# Patient Record
Sex: Female | Born: 1960 | Race: White | Hispanic: No | Smoking: Never smoker
Health system: Southern US, Community
[De-identification: ages and names within clinical notes are randomized; demographics above are authoritative.]

## PROBLEM LIST (undated history)

## (undated) DIAGNOSIS — E8881 Metabolic syndrome: Secondary | ICD-10-CM

## (undated) DIAGNOSIS — N950 Postmenopausal bleeding: Secondary | ICD-10-CM

## (undated) DIAGNOSIS — I10 Essential (primary) hypertension: Secondary | ICD-10-CM

## (undated) DIAGNOSIS — Z973 Presence of spectacles and contact lenses: Secondary | ICD-10-CM

## (undated) DIAGNOSIS — M179 Osteoarthritis of knee, unspecified: Secondary | ICD-10-CM

## (undated) DIAGNOSIS — J302 Other seasonal allergic rhinitis: Secondary | ICD-10-CM

## (undated) DIAGNOSIS — T7840XA Allergy, unspecified, initial encounter: Secondary | ICD-10-CM

## (undated) DIAGNOSIS — F419 Anxiety disorder, unspecified: Secondary | ICD-10-CM

## (undated) DIAGNOSIS — M1710 Unilateral primary osteoarthritis, unspecified knee: Secondary | ICD-10-CM

## (undated) DIAGNOSIS — C4490 Unspecified malignant neoplasm of skin, unspecified: Secondary | ICD-10-CM

## (undated) DIAGNOSIS — E785 Hyperlipidemia, unspecified: Secondary | ICD-10-CM

## (undated) DIAGNOSIS — Z8619 Personal history of other infectious and parasitic diseases: Secondary | ICD-10-CM

## (undated) DIAGNOSIS — B009 Herpesviral infection, unspecified: Secondary | ICD-10-CM

## (undated) HISTORY — DX: Unspecified malignant neoplasm of skin, unspecified: C44.90

## (undated) HISTORY — DX: Herpesviral infection, unspecified: B00.9

## (undated) HISTORY — DX: Hyperlipidemia, unspecified: E78.5

## (undated) HISTORY — PX: ABDOMINAL HYSTERECTOMY: SHX81

## (undated) HISTORY — DX: Osteoarthritis of knee, unspecified: M17.9

## (undated) HISTORY — DX: Allergy, unspecified, initial encounter: T78.40XA

## (undated) HISTORY — DX: Personal history of other infectious and parasitic diseases: Z86.19

## (undated) HISTORY — PX: BREAST CYST EXCISION: SHX579

## (undated) HISTORY — DX: Metabolic syndrome: E88.81

## (undated) HISTORY — DX: Essential (primary) hypertension: I10

## (undated) HISTORY — DX: Other seasonal allergic rhinitis: J30.2

## (undated) HISTORY — DX: Unilateral primary osteoarthritis, unspecified knee: M17.10

---
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
## 1995-10-09 HISTORY — PX: DILATION AND CURETTAGE OF UTERUS: SHX78

## 1998-03-25 ENCOUNTER — Other Ambulatory Visit: Admission: RE | Admit: 1998-03-25 | Discharge: 1998-03-25 | Payer: Self-pay | Admitting: *Deleted

## 2006-03-14 ENCOUNTER — Other Ambulatory Visit: Admission: RE | Admit: 2006-03-14 | Discharge: 2006-03-14 | Payer: Self-pay | Admitting: Obstetrics & Gynecology

## 2006-03-28 ENCOUNTER — Encounter: Admission: RE | Admit: 2006-03-28 | Discharge: 2006-03-28 | Payer: Self-pay | Admitting: Obstetrics & Gynecology

## 2015-02-06 HISTORY — PX: KNEE ARTHROSCOPY: SHX127

## 2015-09-09 ENCOUNTER — Ambulatory Visit (INDEPENDENT_AMBULATORY_CARE_PROVIDER_SITE_OTHER): Payer: BLUE CROSS/BLUE SHIELD | Admitting: Obstetrics and Gynecology

## 2015-09-09 ENCOUNTER — Encounter: Payer: Self-pay | Admitting: Obstetrics and Gynecology

## 2015-09-09 VITALS — BP 136/82 | HR 96 | Ht 63.5 in | Wt 198.0 lb

## 2015-09-09 DIAGNOSIS — N898 Other specified noninflammatory disorders of vagina: Secondary | ICD-10-CM

## 2015-09-09 DIAGNOSIS — R300 Dysuria: Secondary | ICD-10-CM | POA: Diagnosis not present

## 2015-09-09 DIAGNOSIS — N766 Ulceration of vulva: Secondary | ICD-10-CM | POA: Diagnosis not present

## 2015-09-09 LAB — POCT URINALYSIS DIPSTICK
BILIRUBIN UA: NEGATIVE
Glucose, UA: NEGATIVE
Ketones, UA: NEGATIVE
NITRITE UA: NEGATIVE
PH UA: 6
Protein, UA: NEGATIVE
UROBILINOGEN UA: NEGATIVE

## 2015-09-09 MED ORDER — VALACYCLOVIR HCL 1 G PO TABS
1000.0000 mg | ORAL_TABLET | Freq: Two times a day (BID) | ORAL | Status: DC
Start: 2015-09-09 — End: 2015-09-28

## 2015-09-09 MED ORDER — LIDOCAINE 5 % EX OINT: 1.0000 "application " | TOPICAL_OINTMENT | Freq: Three times a day (TID) | CUTANEOUS | Status: DC

## 2015-09-09 NOTE — Progress Notes (Signed)
Patient ID: Jeanne Parks, female   DOB: [birth year], 54 y.o.   MRN: [id number]  GYNECOLOGY  VISIT   HPI: 54 y.o.   Married  Caucasian  female   [id number] with Patient's last menstrual period was 01/06/2014 (approximate).   here for vaginal irritation. She c/o a 1 week h/o mild vulvar irritation, over the last few days it has gotten worse. No pruritus, c/o generalized rawness. Feels like she is using sandpaper when she wipes. No blisters or sores, no h/o herpes. Her husband has a h/o oral HSV, he is her only partner. She c/o yellow d/c. No odor. She tried monistat x 1, 2 days ago, burned and didn't help. Only external burning with urination. No frequency or urgency to void.  Husband has a h/o a kidney transplant and is on immunosuppression.   GYNECOLOGIC HISTORY: Patient's last menstrual period was 01/06/2014 (approximate). Contraception: condoms and post menopausal status Menopausal hormone therapy: None        OB History    Gravida Para Term Preterm AB TAB SAB Ectopic Multiple Living   2 1 1  0 1 0 1 0 0 1         There are no active problems to display for this patient.   History reviewed. No pertinent past medical history.  Past Surgical History  Procedure Laterality Date  . Cesarean section  08/27/86  . Dilation and curettage of uterus  1997    Miscarriage  . Knee arthroscopy Right 02/2015  . Breast surgery Left 1990's    removal of cyst    Current Outpatient Prescriptions  Medication Sig Dispense Refill  . Biotin 10 MG TABS Take 1 tablet by mouth daily.    Marland Kitchen ibuprofen (ADVIL,MOTRIN) 800 MG tablet Take 800 mg by mouth daily.     No current facility-administered medications for this visit.     ALLERGIES: Review of patient's allergies indicates no known allergies.  Family History  Problem Relation Age of Onset  . Breast cancer Mother 8    lumpectomy and radiation; estrogen fed  . Heart disease Father   . Diabetes Sister 13    Type 1  . Cancer Maternal Aunt    lung  . Cancer Paternal Uncle     ? type    Social History   Social History  . Marital Status: Married    Spouse Name: N/A  . Number of Children: 1  . Years of Education: N/A   Occupational History  . Not on file.   Social History Main Topics  . Smoking status: Never Smoker   . Smokeless tobacco: Never Used  . Alcohol Use: 0.0 - 0.6 oz/week    0-1 Standard drinks or equivalent per week  . Drug Use: No  . Sexual Activity:    Partners: Male    Birth Control/ Protection: Post-menopausal, Condom   Other Topics Concern  . Not on file   Social History Narrative  . No narrative on file    Review of Systems  Genitourinary: Positive for dysuria.  All other systems reviewed and are negative.   PHYSICAL EXAMINATION:    BP 136/82 mmHg  Pulse 96  Ht 5' 3.5" (1.613 m)  Wt 198 lb (89.812 kg)  BMI 34.52 kg/m2  LMP 01/06/2014 (Approximate)    General appearance: alert, cooperative and appears stated age  Pelvic: External genitalia: several small ulcers just outside the vagina on the inner labia minora bilaterally, opening of the vagina is erythematous  Urethra:  normal appearing urethra with no masses, tenderness or lesions              Bartholins and Skenes: normal                 Vagina: erythema near the opening. Slight increase in white watery d/c              Cervix: no lesions               Chaperone was present for exam.  Wet prep: ? Clue (suspect artifact from the monistat), no trich, ++ wbc KOH: no yeast PH: 5   ASSESSMENT Vulvar ulcers, cw herpes. Husband with a h/o oral hsv Vaginal d/c, negative slides Overdue for an annual and mammogram     PLAN Valtrex x 10 days HSV culture Wet prep probe Will discuss suppression after results are back (husband is immunosuppressed) Will set her up for an annual Overdue for a mammogram   An After Visit Summary was printed and given to the patient.  30 minutes face to face time of which over 50% was  spent in counseling.

## 2015-09-10 LAB — WET PREP BY MOLECULAR PROBE
CANDIDA SPECIES: NEGATIVE
GARDNERELLA VAGINALIS: POSITIVE — AB
Trichomonas vaginosis: NEGATIVE

## 2015-09-12 ENCOUNTER — Other Ambulatory Visit: Payer: Self-pay | Admitting: *Deleted

## 2015-09-12 LAB — HERPES SIMPLEX VIRUS CULTURE: ORGANISM ID, BACTERIA: DETECTED

## 2015-09-12 MED ORDER — METRONIDAZOLE 500 MG PO TABS: 500.0000 mg | ORAL_TABLET | Freq: Two times a day (BID) | ORAL | Status: DC

## 2015-09-14 ENCOUNTER — Telehealth: Payer: Self-pay | Admitting: *Deleted

## 2015-09-14 NOTE — Telephone Encounter (Signed)
Can we call this patient back together please ? -eh

## 2015-09-28 ENCOUNTER — Ambulatory Visit (INDEPENDENT_AMBULATORY_CARE_PROVIDER_SITE_OTHER): Payer: BLUE CROSS/BLUE SHIELD | Admitting: Obstetrics and Gynecology

## 2015-09-28 ENCOUNTER — Encounter: Payer: Self-pay | Admitting: Obstetrics and Gynecology

## 2015-09-28 VITALS — BP 142/80 | HR 96 | Resp 14 | Ht 62.75 in | Wt 197.0 lb

## 2015-09-28 DIAGNOSIS — Z124 Encounter for screening for malignant neoplasm of cervix: Secondary | ICD-10-CM | POA: Diagnosis not present

## 2015-09-28 DIAGNOSIS — Z1211 Encounter for screening for malignant neoplasm of colon: Secondary | ICD-10-CM

## 2015-09-28 DIAGNOSIS — Z0000 Encounter for general adult medical examination without abnormal findings: Secondary | ICD-10-CM | POA: Diagnosis not present

## 2015-09-28 DIAGNOSIS — K5900 Constipation, unspecified: Secondary | ICD-10-CM

## 2015-09-28 DIAGNOSIS — K602 Anal fissure, unspecified: Secondary | ICD-10-CM | POA: Diagnosis not present

## 2015-09-28 DIAGNOSIS — R635 Abnormal weight gain: Secondary | ICD-10-CM | POA: Diagnosis not present

## 2015-09-28 DIAGNOSIS — Z833 Family history of diabetes mellitus: Secondary | ICD-10-CM | POA: Diagnosis not present

## 2015-09-28 DIAGNOSIS — B372 Candidiasis of skin and nail: Secondary | ICD-10-CM

## 2015-09-28 DIAGNOSIS — A6000 Herpesviral infection of urogenital system, unspecified: Secondary | ICD-10-CM

## 2015-09-28 DIAGNOSIS — Z01419 Encounter for gynecological examination (general) (routine) without abnormal findings: Secondary | ICD-10-CM

## 2015-09-28 LAB — CBC
HEMATOCRIT: 38.4 % (ref 36.0–46.0)
Hemoglobin: 12.3 g/dL (ref 12.0–15.0)
MCH: 29 pg (ref 26.0–34.0)
MCHC: 32 g/dL (ref 30.0–36.0)
MCV: 90.6 fL (ref 78.0–100.0)
MPV: 9.8 fL (ref 8.6–12.4)
Platelets: 340 10*3/uL (ref 150–400)
RBC: 4.24 MIL/uL (ref 3.87–5.11)
RDW: 15 % (ref 11.5–15.5)
WBC: 7.6 10*3/uL (ref 4.0–10.5)

## 2015-09-28 LAB — COMPREHENSIVE METABOLIC PANEL
ALT: 22 U/L (ref 6–29)
AST: 19 U/L (ref 10–35)
Albumin: 4.2 g/dL (ref 3.6–5.1)
Alkaline Phosphatase: 121 U/L (ref 33–130)
BUN: 16 mg/dL (ref 7–25)
CHLORIDE: 102 mmol/L (ref 98–110)
CO2: 27 mmol/L (ref 20–31)
Calcium: 9.8 mg/dL (ref 8.6–10.4)
Creat: 0.8 mg/dL (ref 0.50–1.05)
Glucose, Bld: 109 mg/dL — ABNORMAL HIGH (ref 65–99)
POTASSIUM: 4.2 mmol/L (ref 3.5–5.3)
Sodium: 139 mmol/L (ref 135–146)
TOTAL PROTEIN: 7.1 g/dL (ref 6.1–8.1)
Total Bilirubin: 0.6 mg/dL (ref 0.2–1.2)

## 2015-09-28 LAB — TSH: TSH: 1.13 u[IU]/mL (ref 0.350–4.500)

## 2015-09-28 LAB — LIPID PANEL
CHOL/HDL RATIO: 4.2 ratio (ref <=5.0)
CHOLESTEROL: 208 mg/dL — AB (ref 125–200)
HDL: 49 mg/dL (ref >=46)
LDL Cholesterol: 141 mg/dL — ABNORMAL HIGH (ref <130)
Triglycerides: 91 mg/dL (ref <150)
VLDL: 18 mg/dL (ref <30)

## 2015-09-28 MED ORDER — HYDROCORTISONE 2.5 % RE CREA: TOPICAL_CREAM | RECTAL | Status: DC

## 2015-09-28 MED ORDER — VALACYCLOVIR HCL 500 MG PO TABS: ORAL_TABLET | ORAL | Status: DC

## 2015-09-28 MED ORDER — NYSTATIN 100000 UNIT/GM EX CREA: 1.0000 "application " | TOPICAL_CREAM | Freq: Two times a day (BID) | CUTANEOUS | Status: DC

## 2015-09-28 NOTE — Patient Instructions (Signed)
Anal Fissure, Adult An anal fissure is a small tear or crack in the skin around the anus. Bleeding from a fissure usually stops on its own within a few minutes. However, bleeding will often occur again with each bowel movement until the crack heals. CAUSES This condition may be caused by:  Passing large, hard stool (feces).  Frequent diarrhea.  Constipation.  Inflammatory bowel disease (Crohn disease or ulcerative colitis).  Infections.  Anal sex. SYMPTOMS Symptoms of this condition include:  Bleeding from the rectum.  Small amounts of blood seen on your stool, on toilet paper, or in the toilet after a bowel movement.  Painful bowel movements.  Itching or irritation around the anus. DIAGNOSIS A health care provider may diagnose this condition by closely examining the anal area. An anal fissure can usually be seen with careful inspection. In some cases, a rectal exam may be performed, or a short tube (anoscope) may be used to examine the anal canal. TREATMENT Treatment for this condition may include:  Taking steps to avoid constipation. This may include making changes to your diet, such as increasing your intake of fiber or fluid.  Taking fiber supplements. These supplements can soften your stool to help make bowel movements easier. Your health care provider may also prescribe a stool softener if your stool is often hard.  Taking sitz baths. This may help to heal the tear.  Using medicated creams or ointments. These may be prescribed to lessen discomfort. HOME CARE INSTRUCTIONS Eating and Drinking  Avoid foods that may be constipating, such as bananas and dairy products.  Drink enough fluid to keep your urine clear or pale yellow.  Maintain a diet that is high in fruits, whole grains, and vegetables. General Instructions  Keep the anal area as clean and dry as possible.  Take sitz baths as told by your health care provider. Do not use soap in the sitz baths.  Take  over-the-counter and prescription medicines only as told by your health care provider.  Use creams or ointments only as told by your health care provider.  Keep all follow-up visits as told by your health care provider. This is important. SEEK MEDICAL CARE IF:  You have more bleeding.  You have a fever.  You have diarrhea that is mixed with blood.  You continue to have pain.  Your problem is getting worse rather than better.   This information is not intended to replace advice given to you by your health care provider. Make sure you discuss any questions you have with your health care provider.   Document Released: 09/24/2005 Document Revised: 06/15/2015 Document Reviewed: 12/20/2014 Elsevier Interactive Patient Education 2016 [employer]. About Constipation  Constipation Overview Constipation is the most common gastrointestinal complaint - about 4 million Americans experience constipation and make 2.5 million physician visits a year to get help for the problem.  Constipation can occur when the colon absorbs too much water, the colon's muscle contraction is slow or sluggish, and/or there is delayed transit time through the colon.  The result is stool that is hard and dry.  Indicators of constipation include straining during bowel movements greater than 25% of the time, having fewer than three bowel movements per week, and/or the feeling of incomplete evacuation.  There are established guidelines ([place] II ) for defining constipation. A person needs to have two or more of the following symptoms for at least 12 weeks (not necessarily consecutive) in the preceding 12 months: . Straining in  greater than  25% of bowel movements . Lumpy or hard stools in greater than 25% of bowel movements . Sensation of incomplete emptying in greater than 25% of bowel movements . Sensation of anorectal obstruction/blockade in greater than 25% of bowel movements . Manual maneuvers to help empty greater than  25% of bowel movements (e.g., digital evacuation, support of the pelvic floor)  . Less than  3 bowel movements/week . Loose stools are not present, and criteria for irritable bowel syndrome are insufficient  Common Causes of Constipation . Lack of fiber in your diet . Lack of physical activity . Medications, including iron and calcium supplements  . Dairy intake . Dehydration . Abuse of laxatives  Travel  Irritable Bowel Syndrome  Pregnancy  Luteal phase of menstruation (after ovulation and before menses)  Colorectal problems  Intestinal Dysfunction  Treating Constipation  There are several ways of treating constipation, including changes to diet and exercise, use of laxatives, adjustments to the pelvic floor, and scheduled toileting.  These treatments include: . increasing fiber and fluids in the diet  . increasing physical activity . learning muscle coordination   learning proper toileting techniques and toileting modifications   designing and sticking  to a toileting schedule     2007, [employer] Doc.22  EXERCISE AND DIET:  We recommended that you start or continue a regular exercise program for good health. Regular exercise means any activity that makes your heart beat faster and makes you sweat.  We recommend exercising at least 30 minutes per day at least 3 days a week, preferably 4 or 5.  We also recommend a diet low in fat and sugar.  Inactivity, poor dietary choices and obesity can cause diabetes, heart attack, stroke, and kidney damage, among others.    ALCOHOL AND SMOKING:  Women should limit their alcohol intake to no more than 7 drinks/beers/glasses of wine (combined, not each!) per week. Moderation of alcohol intake to this level decreases your risk of breast cancer and liver damage. And of course, no recreational drugs are part of a healthy lifestyle.  And absolutely no smoking or even second hand smoke. Most people know smoking can cause heart  and lung diseases, but did you know it also contributes to weakening of your bones? Aging of your skin?  Yellowing of your teeth and nails?  CALCIUM AND VITAMIN D:  Adequate intake of calcium and Vitamin D are recommended.  The recommendations for exact amounts of these supplements seem to change often, but generally speaking 600 mg of calcium (either carbonate or citrate) and 800 units of Vitamin D per day seems prudent. Certain women may benefit from higher intake of Vitamin D.  If you are among these women, your doctor will have told you during your visit.    PAP SMEARS:  Pap smears, to check for cervical cancer or precancers,  have traditionally been done yearly, although recent scientific advances have shown that most women can have pap smears less often.  However, every woman still should have a physical exam from her gynecologist every year. It will include a breast check, inspection of the vulva and vagina to check for abnormal growths or skin changes, a visual exam of the cervix, and then an exam to evaluate the size and shape of the uterus and ovaries.  And after 53 years of age, a rectal exam is indicated to check for rectal cancers. We will also provide age appropriate advice regarding health maintenance, like when you should have certain vaccines, screening  for sexually transmitted diseases, bone density testing, colonoscopy, mammograms, etc.   MAMMOGRAMS:  All women over 62 years old should have a yearly mammogram. Many facilities now offer a "3D" mammogram, which may cost around $50 extra out of pocket. If possible,  we recommend you accept the option to have the 3D mammogram performed.  It both reduces the number of women who will be called back for extra views which then turn out to be normal, and it is better than the routine mammogram at detecting truly abnormal areas.    COLONOSCOPY:  Colonoscopy to screen for colon cancer is recommended for all women at age 61.  We know, you hate the  idea of the prep.  We agree, BUT, having colon cancer and not knowing it is worse!!  Colon cancer so often starts as a polyp that can be seen and removed at colonscopy, which can quite literally save your life!  And if your first colonoscopy is normal and you have no family history of colon cancer, most women don't have to have it again for 10 years.  Once every ten years, you can do something that may end up saving your life, right?  We will be happy to help you get it scheduled when you are ready.  Be sure to check your insurance coverage so you understand how much it will cost.  It may be covered as a preventative service at no cost, but you should check your particular policy.

## 2015-09-28 NOTE — Progress Notes (Signed)
Patient ID: Jeanne Parks, female   DOB: 1961/04/02, 54 y.o.   MRN: [id number] 54 y.o. [id number] MarriedCaucasianF here for annual exam.  The patient was recently diagnosed with genital HSV, culture + type 1 (husband with oral hsv). The patient is PMP, no bleeding. She is hot all the time. Having hot flashes several times a week. She is having some hot flashes at night, several times a week. No actual sweats.  She c/o a rash intermittently in the creases of her legs, usually gets better with cornstarch, persisting a little longer this time, slightly itchy. Present for a few weeks. She is having issues with constipation, occasionally will notice blood when she wipes when she has to strain, associated with pain. This has happened off and on in the last few months. Last bleed with BM last night. No vaginal irritation, no dryness with intercourse.    Patient's last menstrual period was 01/06/2014 (approximate).          Sexually active: Yes.    The current method of family planning is post menopausal status/ condoms.    Exercising: No.  The patient does not participate in regular exercise at present. Smoker:  no  Health Maintenance: Pap:  2007 WNL History of abnormal Pap:  no MMG:  2007 WNL  Colonoscopy:  Never BMD:   Never TDaP:  unsure Gardasil: N/A   reports that she has never smoked. She has never used smokeless tobacco. She reports that she drinks about 0.6 - 1.2 oz of alcohol per week. She reports that she does not use illicit drugs.  Past Medical History  Diagnosis Date  . HSV-1 infection     Past Surgical History  Procedure Laterality Date  . Cesarean section  08/27/86  . Dilation and curettage of uterus  1997    Miscarriage  . Knee arthroscopy Right 02/2015  . Breast surgery Left 1990's    removal of cyst    Current Outpatient Prescriptions  Medication Sig Dispense Refill  . Biotin 10 MG TABS Take 1 tablet by mouth daily.    Marland Kitchen ibuprofen (ADVIL,MOTRIN) 800 MG tablet Take 800  mg by mouth daily.    Marland Kitchen lidocaine (XYLOCAINE) 5 % ointment Apply 1 application topically 3 (three) times daily. Prn (Patient not taking: Reported on 09/28/2015) 30 g 0  . valACYclovir (VALTREX) 1000 MG tablet Take 1 tablet (1,000 mg total) by mouth 2 (two) times daily. (Patient not taking: Reported on 09/28/2015) 20 tablet 0   No current facility-administered medications for this visit.    Family History  Problem Relation Age of Onset  . Breast cancer Mother 3    lumpectomy and radiation; estrogen fed  . Heart disease Father   . Diabetes Sister 13    Type 1  . Cancer Maternal Aunt     lung  . Cancer Paternal Uncle     ? type    Review of Systems  Constitutional: Negative.   HENT: Negative.   Eyes: Negative.   Respiratory: Negative.   Cardiovascular: Negative.   Gastrointestinal: Positive for constipation.  Endocrine: Negative.   Genitourinary: Negative.   Musculoskeletal: Negative.   Skin: Positive for rash.       Rash in the groin area and under breast  Allergic/Immunologic: Negative.   Neurological: Negative.   Psychiatric/Behavioral: Negative.   She has been constipated, straining recently, sometime a small spot of blood when she wipes, when it hurts. BM q day, hard.   Exam:   BP 148/84  mmHg  Pulse 96  Resp 14  Ht 5' 2.75" (1.594 m)  Wt 197 lb (89.359 kg)  BMI 35.17 kg/m2  LMP 01/06/2014 (Approximate)  Weight change: '@WEIGHTCHANGE' @ Height:   Height: 5' 2.75" (159.4 cm)  Ht Readings from Last 3 Encounters:  09/28/15 5' 2.75" (1.594 m)  09/09/15 5' 3.5" (1.613 m)    General appearance: alert, cooperative and appears stated age Head: Normocephalic, without obvious abnormality, atraumatic Neck: no adenopathy, supple, symmetrical, trachea midline and thyroid normal to inspection and palpation Lungs: clear to auscultation bilaterally Breasts: normal appearance, no masses or tenderness Heart: regular rate and rhythm Abdomen: soft, non-tender; bowel sounds normal;  no masses,  no organomegaly Extremities: extremities normal, atraumatic, no cyanosis or edema Skin: Skin color, texture, turgor normal. Erythematous scaly rash bilateral groin, cw candida intertrigo Lymph nodes: Cervical, supraclavicular, and axillary nodes normal. No abnormal inguinal nodes palpated Neurologic: Grossly normal   Pelvic: External genitalia:  no lesions              Urethra:  normal appearing urethra with no masses, tenderness or lesions              Bartholins and Skenes: normal                 Vagina: normal appearing vagina with normal color and discharge, no lesions              Cervix: no lesions               Bimanual Exam:  Uterus:  normal size, contour, position, consistency, mobility, non-tender              Adnexa: no mass, fullness, tenderness               Rectovaginal: Confirms               Anus:  normal sphincter tone, anal fissure at 12 o'clock  Chaperone was present for exam.  A:  Well Woman with normal exam  Family history of breast cancer and diabetes  Constipation  Genital HSV  Anal fissure  Candida intertrigo  P:   Pap with hpv  Mammogram  Discussed constipation, dietary changes, metamucil  Stool testing for blood, kit given  Colonoscopy recommended, she will consider  Discussed calcium and vit D  Breast self exams  Discussed weight loss  Anusol for anal fissure, discussed the importance of soft stool to get her fissure to heal  Nystatin for yeast

## 2015-09-29 LAB — HEMOGLOBIN A1C
Hgb A1c MFr Bld: 5.7 % — ABNORMAL HIGH (ref <5.7)
Mean Plasma Glucose: 117 mg/dL — ABNORMAL HIGH (ref <117)

## 2015-09-29 LAB — VITAMIN D 25 HYDROXY (VIT D DEFICIENCY, FRACTURES): VIT D 25 HYDROXY: 13 ng/mL — AB (ref 30–100)

## 2015-09-30 LAB — IPS PAP TEST WITH HPV

## 2015-10-06 ENCOUNTER — Telehealth: Payer: Self-pay | Admitting: Emergency Medicine

## 2015-10-06 ENCOUNTER — Encounter: Payer: Self-pay | Admitting: Emergency Medicine

## 2015-10-06 DIAGNOSIS — E559 Vitamin D deficiency, unspecified: Secondary | ICD-10-CM

## 2015-10-06 MED ORDER — VITAMIN D (ERGOCALCIFEROL) 1.25 MG (50000 UNIT) PO CAPS: 50000.0000 [IU] | ORAL_CAPSULE | ORAL | Status: DC

## 2015-10-06 NOTE — Telephone Encounter (Signed)
-----   Message from [name], MD sent at 10/05/2015  5:11 PM EST ----- 02 Please inform the patient that her vit D level is very low and call in 50,000 IU of vit d q week x 12 weeks. Then she should return for another vit D level. She is pre-diabetic and needs to f/u with a primary MD, please give her names if needed. Her total cholesterol and LDL are slightly elevated. She should be eating a diet low in saturated fats and exercise regularly.

## 2015-10-06 NOTE — Telephone Encounter (Signed)
Call to patient and she is given results from Dr. Talbert Nan.  Voices clear understanding of results and follow up as directed by Dr. Talbert Nan. Knows to follow up for lab draw aftger completing vitamin D prescription.   Patient given numbers for [facility] practices and encouraged to follow up and establish primary care for cholesterol and A1C. She will call back if she has any difficulties obtaining primary care appointments in [place], [place].   Patient requests that copies of lab results be mailed to her home address. Patient name, DOB and home address confirmed verbally with patient and verbal release of records completed and mailed to home address of record.  Routing to provider for final review. Patient agreeable to disposition. Will close encounter.   02 recall placed.

## 2015-12-27 ENCOUNTER — Other Ambulatory Visit: Payer: Self-pay | Admitting: Obstetrics and Gynecology

## 2015-12-27 NOTE — Telephone Encounter (Signed)
Medication refill request: Vitamin D Last AEX:  09-28-15 Next AEX: 10-24-16 Last MMG (if hormonal medication request): 03/29/06 WNL Refill authorized: please advise

## 2015-12-27 NOTE — Telephone Encounter (Signed)
[place] in regards to med refill- eh

## 2015-12-27 NOTE — Telephone Encounter (Signed)
I spoke with patient and set her up for vitamin D recheck on 12/29/15.

## 2015-12-27 NOTE — Telephone Encounter (Signed)
The patient needs a repeat vit D level after 12 weeks of taking the vit D 50,000 IU a week. Please set her up for a lab appointment. I would not refill the script until we have the new level. The order was already placed.

## 2015-12-29 ENCOUNTER — Other Ambulatory Visit (INDEPENDENT_AMBULATORY_CARE_PROVIDER_SITE_OTHER): Payer: BLUE CROSS/BLUE SHIELD

## 2015-12-29 DIAGNOSIS — E559 Vitamin D deficiency, unspecified: Secondary | ICD-10-CM

## 2015-12-29 LAB — VITAMIN D 25 HYDROXY (VIT D DEFICIENCY, FRACTURES): VIT D 25 HYDROXY: 42 ng/mL (ref 30–100)

## 2016-01-26 NOTE — Addendum Note (Signed)
Addended by: Graylon Good on: 01/26/2016 12:31 PM   Modules accepted: Orders, SmartSet

## 2016-10-24 ENCOUNTER — Ambulatory Visit: Payer: BLUE CROSS/BLUE SHIELD | Admitting: Obstetrics and Gynecology

## 2016-10-31 ENCOUNTER — Encounter: Payer: Self-pay | Admitting: Obstetrics and Gynecology

## 2016-10-31 ENCOUNTER — Ambulatory Visit (INDEPENDENT_AMBULATORY_CARE_PROVIDER_SITE_OTHER): Payer: BLUE CROSS/BLUE SHIELD | Admitting: Obstetrics and Gynecology

## 2016-10-31 VITALS — BP 130/74 | HR 100 | Resp 16 | Ht 62.75 in | Wt 197.0 lb

## 2016-10-31 DIAGNOSIS — Z0000 Encounter for general adult medical examination without abnormal findings: Secondary | ICD-10-CM | POA: Diagnosis not present

## 2016-10-31 DIAGNOSIS — A6000 Herpesviral infection of urogenital system, unspecified: Secondary | ICD-10-CM

## 2016-10-31 DIAGNOSIS — Z01419 Encounter for gynecological examination (general) (routine) without abnormal findings: Secondary | ICD-10-CM | POA: Diagnosis not present

## 2016-10-31 DIAGNOSIS — E559 Vitamin D deficiency, unspecified: Secondary | ICD-10-CM

## 2016-10-31 DIAGNOSIS — E663 Overweight: Secondary | ICD-10-CM

## 2016-10-31 DIAGNOSIS — Z23 Encounter for immunization: Secondary | ICD-10-CM | POA: Diagnosis not present

## 2016-10-31 LAB — COMPREHENSIVE METABOLIC PANEL
ALBUMIN: 4.1 g/dL (ref 3.6–5.1)
ALT: 17 U/L (ref 6–29)
AST: 17 U/L (ref 10–35)
Alkaline Phosphatase: 124 U/L (ref 33–130)
BILIRUBIN TOTAL: 0.4 mg/dL (ref 0.2–1.2)
BUN: 19 mg/dL (ref 7–25)
CHLORIDE: 105 mmol/L (ref 98–110)
CO2: 25 mmol/L (ref 20–31)
CREATININE: 1 mg/dL (ref 0.50–1.05)
Calcium: 9.7 mg/dL (ref 8.6–10.4)
Glucose, Bld: 108 mg/dL — ABNORMAL HIGH (ref 65–99)
Potassium: 4.3 mmol/L (ref 3.5–5.3)
SODIUM: 141 mmol/L (ref 135–146)
TOTAL PROTEIN: 6.8 g/dL (ref 6.1–8.1)

## 2016-10-31 LAB — LIPID PANEL
CHOL/HDL RATIO: 4 ratio (ref <5.0)
Cholesterol: 191 mg/dL (ref <200)
HDL: 48 mg/dL — AB (ref >50)
LDL CALC: 122 mg/dL — AB (ref <100)
Triglycerides: 105 mg/dL (ref <150)
VLDL: 21 mg/dL (ref <30)

## 2016-10-31 LAB — CBC
HCT: 40.5 % (ref 35.0–45.0)
HEMOGLOBIN: 12.9 g/dL (ref 11.7–15.5)
MCH: 29.7 pg (ref 27.0–33.0)
MCHC: 31.9 g/dL — ABNORMAL LOW (ref 32.0–36.0)
MCV: 93.1 fL (ref 80.0–100.0)
MPV: 9.7 fL (ref 7.5–12.5)
PLATELETS: 299 10*3/uL (ref 140–400)
RBC: 4.35 MIL/uL (ref 3.80–5.10)
RDW: 14 % (ref 11.0–15.0)
WBC: 10.2 10*3/uL (ref 3.8–10.8)

## 2016-10-31 MED ORDER — VALACYCLOVIR HCL 500 MG PO TABS: ORAL_TABLET | ORAL | 3 refills | Status: DC

## 2016-10-31 NOTE — Patient Instructions (Signed)

## 2016-10-31 NOTE — Progress Notes (Signed)
56 y.o. [id number] MarriedCaucasianF here for annual exam.   She was diagnosed with genital herpes last year. Thinks she has the beginning of an outbreak and started Valtrex yesterday. No other outbreak. No vaginal bleeding. No dyspareunia.  No medical changes.     Patient's last menstrual period was 01/06/2014 (approximate).          Sexually active: Yes.    The current method of family planning is post menopausal status.    Exercising: No.  The patient does not participate in regular exercise at present. Smoker:  no  Health Maintenance: Pap:  09/28/15 Pap smear and HR HPV negative History of abnormal Pap:  no MMG:  03/29/2006 BIRADS 1 negative Colonoscopy:  never BMD:   never TDaP:  unsure Gardasil: N/A   reports that she has never smoked. She has never used smokeless tobacco. She reports that she drinks about 0.6 - 1.2 oz of alcohol per week . She reports that she does not use drugs. 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 Daughter is 32, got married last year. Has 2 step grandchildren (17 and 11).   Past Medical History:  Diagnosis Date  . HSV-1 infection     Past Surgical History:  Procedure Laterality Date  . BREAST SURGERY Left 1990's   removal of cyst  . CESAREAN SECTION  08/27/86  . DILATION AND CURETTAGE OF UTERUS  1997   Miscarriage  . KNEE ARTHROSCOPY Right 02/2015    Current Outpatient Prescriptions  Medication Sig Dispense Refill  . Biotin 10 MG TABS Take 1 tablet by mouth daily.    . hydrocortisone (ANUSOL-HC) 2.5 % rectal cream Apply topically BID prn 30 g 1  . ibuprofen (ADVIL,MOTRIN) 800 MG tablet Take 800 mg by mouth daily.    Marland Kitchen lidocaine (XYLOCAINE) 5 % ointment Apply 1 application topically 3 (three) times daily. Prn 30 g 0  . nystatin cream (MYCOSTATIN) Apply 1 application topically 2 (two) times daily. Apply to affected area BID for up to 7 days. 30 g 1  . valACYclovir (VALTREX) 500 MG tablet 1 tab po BID x 3 days prn 30 tablet 3  . Vitamin D,  Ergocalciferol, (DRISDOL) 50000 units CAPS capsule Take 1 capsule (50,000 Units total) by mouth every 7 (seven) days. 12 capsule 0   No current facility-administered medications for this visit.     Family History  Problem Relation Age of Onset  . Breast cancer Mother 65    lumpectomy and radiation; estrogen fed  . Heart disease Father   . Diabetes Sister 13    Type 1  . Cancer Maternal Aunt     lung  . Cancer Paternal Uncle     ? type    Review of Systems  Constitutional: Negative.   HENT: Negative.   Eyes: Negative.   Respiratory: Negative.   Cardiovascular: Negative.   Gastrointestinal: Negative.   Endocrine: Negative.   Genitourinary: Negative.   Musculoskeletal: Negative.   Skin: Negative.   Allergic/Immunologic: Negative.   Neurological: Negative.   Hematological: Negative.   Psychiatric/Behavioral: Negative.     Exam:   BP 130/74 (BP Location: Right Arm, Patient Position: Sitting, Cuff Size: Normal)   Pulse 100   Resp 16   Ht 5' 2.75" (1.594 m)   Wt 197 lb (89.4 kg)   LMP 01/06/2014 (Approximate)   BMI 35.18 kg/m   Weight change: @WEIGHTCHANGE @ Height:   Height: 5' 2.75" (159.4 cm)  Ht Readings from Last 3 Encounters:  10/31/16  5' 2.75" (1.594 m)  09/28/15 5' 2.75" (1.594 m)  09/09/15 5' 3.5" (1.613 m)    General appearance: alert, cooperative and appears stated age Head: Normocephalic, without obvious abnormality, atraumatic Neck: no adenopathy, supple, symmetrical, trachea midline and thyroid normal to inspection and palpation Lungs: clear to auscultation bilaterally Cardiovascular: regular rate and rhythm Breasts: normal appearance, no masses or tenderness Heart: regular rate and rhythm Abdomen: soft, non-tender; bowel sounds normal; no masses,  no organomegaly Extremities: extremities normal, atraumatic, no cyanosis or edema Skin: Skin color, texture, turgor normal. No rashes or lesions Lymph nodes: Cervical, supraclavicular, and axillary nodes  normal. No abnormal inguinal nodes palpated Neurologic: Grossly normal   Pelvic: External genitalia:  no lesions              Urethra:  normal appearing urethra with no masses, tenderness or lesions              Bartholins and Skenes: normal                 Vagina: normal appearing vagina with normal color and discharge, no lesions              Cervix: no lesions               Bimanual Exam:  Uterus:  normal size, contour, position, consistency, mobility, non-tender              Adnexa: no mass, fullness, tenderness               Rectovaginal: Confirms               Anus:  normal sphincter tone, no lesions  Chaperone was present for exam.  A:  Well Woman with normal exam  H/O genital hsv  Overweight  P:   No pap this year  Mammogram strongly encouraged  Colonoscopy declined, will do the IFOB  Screening labs  Discussed breast self exam  Discussed calcium and vit D intake  Valtrex prn  TDAP today  Discussed weight watchers and exercise

## 2016-11-01 LAB — HEMOGLOBIN A1C
HEMOGLOBIN A1C: 5.7 % — AB (ref <5.7)
Mean Plasma Glucose: 117 mg/dL

## 2016-11-01 LAB — VITAMIN D 25 HYDROXY (VIT D DEFICIENCY, FRACTURES): VIT D 25 HYDROXY: 35 ng/mL (ref 30–100)

## 2016-12-04 ENCOUNTER — Telehealth: Payer: Self-pay | Admitting: Urology

## 2016-12-04 DIAGNOSIS — Z20828 Contact with and (suspected) exposure to other viral communicable diseases: Secondary | ICD-10-CM

## 2016-12-04 MED ORDER — OSELTAMIVIR PHOSPHATE 75 MG PO CAPS: 75.0000 mg | ORAL_CAPSULE | Freq: Every day | ORAL | 0 refills | Status: AC

## 2016-12-04 NOTE — Telephone Encounter (Signed)
I spoke with the patient by phone and she reports that her husband is currently in the hospital at [facility]- admitted for wound infection in his lower leg and subsequently diagnosed with Influenza today as well.  She has had close contact with him and therefore has been exposed.  She is inquiring as to whether she should be treated for Influenza as well?  She is currently asymptomatic- denies fever, chills, cough, N/V/D or myalgias.  I have advised that she take prophylactic Tamiflu since she will need to continue to be available to help care for him (she is his primary caregiver).  I will send Rx for Tamiflu 75mg  po qd x10 days.  She knows to follow up with her PCP should she develop S/S of Flu despite prophylaxis or should she have any systemic complaints while taking the medication.  Nicholos Johns, PA-C

## 2016-12-28 NOTE — Telephone Encounter (Signed)
Duplicate.  See telephone note in chart.

## 2017-09-18 ENCOUNTER — Other Ambulatory Visit: Payer: Self-pay | Admitting: Obstetrics and Gynecology

## 2017-09-18 NOTE — Telephone Encounter (Signed)
Medication refill request: MYCOSTATIN Last AEX:  10/31/16 JJ Next AEX: 11/06/17 Last MMG (if hormonal medication request): 03/29/2006 BIRADS 1 negative Refill authorized: 09/28/15 #30g w/1 refill; today please advise

## 2017-11-06 ENCOUNTER — Telehealth: Payer: Self-pay | Admitting: *Deleted

## 2017-11-06 ENCOUNTER — Ambulatory Visit (INDEPENDENT_AMBULATORY_CARE_PROVIDER_SITE_OTHER): Payer: 59 | Admitting: Obstetrics and Gynecology

## 2017-11-06 ENCOUNTER — Other Ambulatory Visit: Payer: Self-pay

## 2017-11-06 ENCOUNTER — Encounter: Payer: Self-pay | Admitting: Obstetrics and Gynecology

## 2017-11-06 VITALS — BP 150/88 | HR 100 | Resp 18 | Wt 196.0 lb

## 2017-11-06 DIAGNOSIS — E559 Vitamin D deficiency, unspecified: Secondary | ICD-10-CM | POA: Diagnosis not present

## 2017-11-06 DIAGNOSIS — R030 Elevated blood-pressure reading, without diagnosis of hypertension: Secondary | ICD-10-CM | POA: Diagnosis not present

## 2017-11-06 DIAGNOSIS — Z0000 Encounter for general adult medical examination without abnormal findings: Secondary | ICD-10-CM | POA: Diagnosis not present

## 2017-11-06 DIAGNOSIS — Z6835 Body mass index (BMI) 35.0-35.9, adult: Secondary | ICD-10-CM | POA: Diagnosis not present

## 2017-11-06 DIAGNOSIS — R7303 Prediabetes: Secondary | ICD-10-CM | POA: Diagnosis not present

## 2017-11-06 DIAGNOSIS — K602 Anal fissure, unspecified: Secondary | ICD-10-CM

## 2017-11-06 DIAGNOSIS — Z01419 Encounter for gynecological examination (general) (routine) without abnormal findings: Secondary | ICD-10-CM | POA: Diagnosis not present

## 2017-11-06 MED ORDER — VALACYCLOVIR HCL 500 MG PO TABS: ORAL_TABLET | ORAL | 3 refills | Status: DC

## 2017-11-06 MED ORDER — LIDOCAINE 5 % EX OINT: 1.0000 "application " | TOPICAL_OINTMENT | Freq: Three times a day (TID) | CUTANEOUS | 0 refills | Status: DC

## 2017-11-06 MED ORDER — HYDROCORTISONE 2.5 % RE CREA: TOPICAL_CREAM | RECTAL | 1 refills | Status: DC

## 2017-11-06 MED ORDER — NYSTATIN 100000 UNIT/GM EX CREA: TOPICAL_CREAM | CUTANEOUS | 1 refills | Status: DC

## 2017-11-06 NOTE — Telephone Encounter (Signed)
Scheduled patient for screening Mammogram on 11-26-17 - check in at 7:10 for a 7:30. Left message for patient with information. -eh

## 2017-11-06 NOTE — Progress Notes (Signed)
57 y.o. Jeanne Parks MarriedCaucasianF here for annual exam.   Husband with medical issues, h/o kidney transplant, has diabetes, has had injuries at work. Frustrated, no time for herself or to get into shape. She has trouble with her right knee. She has an exercise tape. She is frustrated by her weight.  Sexually active, no pain. No vaginal bleeding.  She thinks she is getting another anal fissure. No rectal bleeding. Felt sore, no constipation.  Requests refills on scripts. Uses nystatin prn, wants valtrex in case of an outbreak.     Patient's last menstrual period was 01/06/2014 (approximate).          Sexually active: Yes.    The current method of family planning is post menopausal status.    Exercising: No.  The patient does not participate in regular exercise at present. Smoker:  no  Health Maintenance: Pap:  09-28-15 WNL NEG HR HPV  History of abnormal Pap:  no MMG:  2007 WNL  Colonoscopy:  Never BMD:   Never TDaP:  10-31-16 Gardasil: N/A   reports that  has never smoked. she has never used smokeless tobacco. She reports that she drinks about 0.6 - 1.2 oz of alcohol per week. She reports that she does not use drugs. 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 Daughter is 83, got married. Has 2 step grandchildren (78 and 68).   Past Medical History:  Diagnosis Date  . HSV-1 infection     Past Surgical History:  Procedure Laterality Date  . BREAST SURGERY Left 1990's   removal of cyst  . CESAREAN SECTION  08/27/86  . DILATION AND CURETTAGE OF UTERUS  1997   Miscarriage  . KNEE ARTHROSCOPY Right 02/2015    Current Outpatient Medications  Medication Sig Dispense Refill  . Biotin 10 MG TABS Take 1 tablet by mouth daily.    . cetirizine (ZYRTEC) 10 MG chewable tablet Chew 10 mg by mouth daily.    . cholecalciferol (VITAMIN D) 1000 units tablet Take 1,000 Units by mouth daily.    . hydrocortisone (ANUSOL-HC) 2.5 % rectal cream Apply topically BID prn 30 g 1  . ibuprofen  (ADVIL,MOTRIN) 800 MG tablet Take 800 mg by mouth daily.    Marland Kitchen lidocaine (XYLOCAINE) 5 % ointment Apply 1 application topically 3 (three) times daily. Prn 30 g 0  . nystatin cream (MYCOSTATIN) APPLY TO AFFECTED AREA TWICE A DAY FOR UPTO 7 DAYS 30 g 1  . valACYclovir (VALTREX) 500 MG tablet 1 tab po BID x 3 days prn 30 tablet 3   No current facility-administered medications for this visit.     Family History  Problem Relation Age of Onset  . Breast cancer Mother 53       lumpectomy and radiation; estrogen fed  . Heart disease Father   . Diabetes Sister 13       Type 1  . Cancer Maternal Aunt        lung  . Cancer Paternal Uncle        ? type    Review of Systems  Constitutional: Negative.   HENT: Negative.   Eyes: Negative.   Respiratory: Negative.   Cardiovascular: Negative.   Gastrointestinal: Negative.   Endocrine: Negative.   Genitourinary: Negative.   Musculoskeletal: Negative.   Skin: Negative.   Allergic/Immunologic: Negative.   Neurological: Negative.   Psychiatric/Behavioral: Negative.     Exam:   BP (!) 150/88 (BP Location: Right Arm, Patient Position: Sitting, Cuff Size: Normal)  Pulse 100   Resp 18   Wt 196 lb (88.9 kg)   LMP 01/06/2014 (Approximate)   BMI 35.00 kg/m   Weight change: @WEIGHTCHANGE @ Height:      Ht Readings from Last 3 Encounters:  10/31/16 5' 2.75" (1.594 m)  09/28/15 5' 2.75" (1.594 m)  09/09/15 5' 3.5" (1.613 m)    General appearance: alert, cooperative and appears stated age Head: Normocephalic, without obvious abnormality, atraumatic Neck: no adenopathy, supple, symmetrical, trachea midline and thyroid normal to inspection and palpation Lungs: clear to auscultation bilaterally Cardiovascular: regular rate and rhythm Breasts: normal appearance, no masses or tenderness Abdomen: soft, non-tender; non distended,  no masses,  no organomegaly Extremities: extremities normal, atraumatic, no cyanosis or edema Skin: Skin color,  texture, turgor normal. No rashes or lesions Lymph nodes: Cervical, supraclavicular, and axillary nodes normal. No abnormal inguinal nodes palpated Neurologic: Grossly normal   Pelvic: External genitalia:  no lesions              Urethra:  normal appearing urethra with no masses, tenderness or lesions              Bartholins and Skenes: normal                 Vagina: normal appearing vagina with normal color and discharge, no lesions              Cervix: no lesions               Bimanual Exam:  Uterus:  normal size, contour, position, consistency, mobility, non-tender              Adnexa: no mass, fullness, tenderness               Rectovaginal: Confirms               Anus:  normal sphincter tone, anal fissure at 4 o'clock  Chaperone was present for exam.  A:  Well Woman with normal exam  Elevated BP  Pre-diabetes  Anal fissure  Intermittent episodes of candida intertrigo, uses nystatin off and on  P:   Pap next year  Over due for a mammogram, willing to have one, requests we schedule it for her  Cologuard  Discussed weight loss, information given  Discussed breast self exam  Discussed calcium and vit D intake  Anusol, lidocaine as needed for her fissure. Discussed getting and keeping her BM's soft  F/U BP still elevated, patient instructed to establish care with primary MD, information given

## 2017-11-06 NOTE — Patient Instructions (Addendum)
I would recommend a mediterranean diet.  A mediterranean diet is high in fruits, vegetables, whole grains, fish, chicken, nuts, healthy fats (olive oil or canola oil). Low fat dairy. Limit butter, margarine, red meat and sweets.   EXERCISE AND DIET:  We recommended that you start or continue a regular exercise program for good health. Regular exercise means any activity that makes your heart beat faster and makes you sweat.  We recommend exercising at least 30 minutes per day at least 3 days a week, preferably 4 or 5.  We also recommend a diet low in fat and sugar.  Inactivity, poor dietary choices and obesity can cause diabetes, heart attack, stroke, and kidney damage, among others.    ALCOHOL AND SMOKING:  Women should limit their alcohol intake to no more than 7 drinks/beers/glasses of wine (combined, not each!) per week. Moderation of alcohol intake to this level decreases your risk of breast cancer and liver damage. And of course, no recreational drugs are part of a healthy lifestyle.  And absolutely no smoking or even second hand smoke. Most people know smoking can cause heart and lung diseases, but did you know it also contributes to weakening of your bones? Aging of your skin?  Yellowing of your teeth and nails?  CALCIUM AND VITAMIN D:  Adequate intake of calcium and Vitamin D are recommended.  The recommendations for exact amounts of these supplements seem to change often, but generally speaking 600 mg of calcium (either carbonate or citrate) and 800 units of Vitamin D per day seems prudent. Certain women may benefit from higher intake of Vitamin D.  If you are among these women, your doctor will have told you during your visit.    PAP SMEARS:  Pap smears, to check for cervical cancer or precancers,  have traditionally been done yearly, although recent scientific advances have shown that most women can have pap smears less often.  However, every woman still should have a physical exam from her  gynecologist every year. It will include a breast check, inspection of the vulva and vagina to check for abnormal growths or skin changes, a visual exam of the cervix, and then an exam to evaluate the size and shape of the uterus and ovaries.  And after 56 years of age, a rectal exam is indicated to check for rectal cancers. We will also provide age appropriate advice regarding health maintenance, like when you should have certain vaccines, screening for sexually transmitted diseases, bone density testing, colonoscopy, mammograms, etc.   MAMMOGRAMS:  All women over 48 years old should have a yearly mammogram. Many facilities now offer a "3D" mammogram, which may cost around $50 extra out of pocket. If possible,  we recommend you accept the option to have the 3D mammogram performed.  It both reduces the number of women who will be called back for extra views which then turn out to be normal, and it is better than the routine mammogram at detecting truly abnormal areas.    COLONOSCOPY:  Colonoscopy to screen for colon cancer is recommended for all women at age 64.  We know, you hate the idea of the prep.  We agree, BUT, having colon cancer and not knowing it is worse!!  Colon cancer so often starts as a polyp that can be seen and removed at colonscopy, which can quite literally save your life!  And if your first colonoscopy is normal and you have no family history of colon cancer, most women don't have  to have it again for 10 years.  Once every ten years, you can do something that may end up saving your life, right?  We will be happy to help you get it scheduled when you are ready.  Be sure to check your insurance coverage so you understand how much it will cost.  It may be covered as a preventative service at no cost, but you should check your particular policy.       Anal Fissure, Adult An anal fissure is a small tear or crack in the skin around the anus. Bleeding from a fissure usually stops on its own  within a few minutes. However, bleeding will often occur again with each bowel movement until the crack heals. What are the causes? This condition may be caused by:  Passing large, hard stool (feces).  Frequent diarrhea.  Constipation.  Inflammatory bowel disease (Crohn disease or ulcerative colitis).  Infections.  Anal sex.  What are the signs or symptoms? Symptoms of this condition include:  Bleeding from the rectum.  Small amounts of blood seen on your stool, on toilet paper, or in the toilet after a bowel movement.  Painful bowel movements.  Itching or irritation around the anus.  How is this diagnosed? A health care provider may diagnose this condition by closely examining the anal area. An anal fissure can usually be seen with careful inspection. In some cases, a rectal exam may be performed, or a short tube (anoscope) may be used to examine the anal canal. How is this treated? Treatment for this condition may include:  Taking steps to avoid constipation. This may include making changes to your diet, such as increasing your intake of fiber or fluid.  Taking fiber supplements. These supplements can soften your stool to help make bowel movements easier. Your health care provider may also prescribe a stool softener if your stool is often hard.  Taking sitz baths. This may help to heal the tear.  Using medicated creams or ointments. These may be prescribed to lessen discomfort.  Follow these instructions at home: Eating and drinking  Avoid foods that may be constipating, such as bananas and dairy products.  Drink enough fluid to keep your urine clear or pale yellow.  Maintain a diet that is high in fruits, whole grains, and vegetables. General instructions  Keep the anal area as clean and dry as possible.  Take sitz baths as told by your health care provider. Do not use soap in the sitz baths.  Take over-the-counter and prescription medicines only as told by  your health care provider.  Use creams or ointments only as told by your health care provider.  Keep all follow-up visits as told by your health care provider. This is important. Contact a health care provider if:  You have more bleeding.  You have a fever.  You have diarrhea that is mixed with blood.  You continue to have pain.  Your problem is getting worse rather than better. This information is not intended to replace advice given to you by your health care provider. Make sure you discuss any questions you have with your health care provider. Document Released: 09/24/2005 Document Revised: 02/01/2016 Document Reviewed: 12/20/2014 Elsevier Interactive Patient Education  [employer].

## 2017-11-06 NOTE — Addendum Note (Signed)
Addended by: Susanne Greenhouse E on: 11/06/2017 12:48 PM   Modules accepted: Orders

## 2017-11-06 NOTE — Telephone Encounter (Signed)
-----   Message from [name], MD sent at 11/06/2017  8:25 AM EST ----- Will you please set her up for a screening mammogram (her request), she hasn't had one in over 10 years.  Early morning is best for her. Thank you, Sharee Pimple

## 2017-11-07 LAB — COMPREHENSIVE METABOLIC PANEL
ALBUMIN: 4.7 g/dL (ref 3.5–5.5)
ALT: 22 IU/L (ref 0–32)
AST: 21 IU/L (ref 0–40)
Albumin/Globulin Ratio: 2 (ref 1.2–2.2)
Alkaline Phosphatase: 128 IU/L — ABNORMAL HIGH (ref 39–117)
BUN / CREAT RATIO: 12 (ref 9–23)
BUN: 13 mg/dL (ref 6–24)
Bilirubin Total: 0.4 mg/dL (ref 0.0–1.2)
CALCIUM: 9.9 mg/dL (ref 8.7–10.2)
CO2: 25 mmol/L (ref 20–29)
CREATININE: 1.05 mg/dL — AB (ref 0.57–1.00)
Chloride: 103 mmol/L (ref 96–106)
GFR calc Af Amer: 69 mL/min/{1.73_m2} (ref >59)
GFR calc non Af Amer: 60 mL/min/{1.73_m2} (ref >59)
GLOBULIN, TOTAL: 2.4 g/dL (ref 1.5–4.5)
Glucose: 115 mg/dL — ABNORMAL HIGH (ref 65–99)
Potassium: 4.3 mmol/L (ref 3.5–5.2)
SODIUM: 143 mmol/L (ref 134–144)
Total Protein: 7.1 g/dL (ref 6.0–8.5)

## 2017-11-07 LAB — CBC
HEMATOCRIT: 41.2 % (ref 34.0–46.6)
HEMOGLOBIN: 13.6 g/dL (ref 11.1–15.9)
MCH: 29.8 pg (ref 26.6–33.0)
MCHC: 33 g/dL (ref 31.5–35.7)
MCV: 90 fL (ref 79–97)
Platelets: 310 10*3/uL (ref 150–379)
RBC: 4.57 x10E6/uL (ref 3.77–5.28)
RDW: 13.8 % (ref 12.3–15.4)
WBC: 8.7 10*3/uL (ref 3.4–10.8)

## 2017-11-07 LAB — LIPID PANEL
CHOL/HDL RATIO: 4.4 ratio (ref 0.0–4.4)
Cholesterol, Total: 196 mg/dL (ref 100–199)
HDL: 45 mg/dL (ref >39)
LDL Calculated: 128 mg/dL — ABNORMAL HIGH (ref 0–99)
Triglycerides: 115 mg/dL (ref 0–149)
VLDL Cholesterol Cal: 23 mg/dL (ref 5–40)

## 2017-11-07 LAB — VITAMIN D 25 HYDROXY (VIT D DEFICIENCY, FRACTURES): Vit D, 25-Hydroxy: 37.1 ng/mL (ref 30.0–100.0)

## 2017-11-07 LAB — HEMOGLOBIN A1C
ESTIMATED AVERAGE GLUCOSE: 120 mg/dL
Hgb A1c MFr Bld: 5.8 % — ABNORMAL HIGH (ref 4.8–5.6)

## 2017-11-07 LAB — TSH: TSH: 1.34 u[IU]/mL (ref 0.450–4.500)

## 2017-11-12 ENCOUNTER — Telehealth: Payer: Self-pay | Admitting: *Deleted

## 2017-11-12 NOTE — Telephone Encounter (Signed)
-----   Message from [name], MD sent at 11/11/2017  5:52 PM EST ----- Please inform the patient that her creatinine was slightly elevated (can occur with dehydration) She is still pre-diabetic She LDL (bad cholesterol) was slightly elevated The rest of her blood work was normal. I know she is working of weight loss, which should help. She should have her creatinine rechecked in the next few months. Hydrate well prior to blood work. Other fasting labs in one year

## 2017-11-12 NOTE — Telephone Encounter (Signed)
Left message to call regarding lab results -eh 

## 2017-11-18 NOTE — Telephone Encounter (Signed)
Spoke with patient and went over results and recommendations. Scheduled got follow up labs -eh

## 2017-11-26 ENCOUNTER — Ambulatory Visit
Admission: RE | Admit: 2017-11-26 | Discharge: 2017-11-26 | Disposition: A | Discharge status: Home / Self Care | Payer: 59 | Source: Ambulatory Visit | Attending: Obstetrics and Gynecology | Admitting: Obstetrics and Gynecology

## 2017-11-26 DIAGNOSIS — Z1231 Encounter for screening mammogram for malignant neoplasm of breast: Secondary | ICD-10-CM | POA: Diagnosis not present

## 2017-11-26 DIAGNOSIS — Z01419 Encounter for gynecological examination (general) (routine) without abnormal findings: Secondary | ICD-10-CM

## 2017-11-27 ENCOUNTER — Other Ambulatory Visit: Payer: Self-pay | Admitting: Obstetrics and Gynecology

## 2017-11-27 DIAGNOSIS — R928 Other abnormal and inconclusive findings on diagnostic imaging of breast: Secondary | ICD-10-CM

## 2017-12-03 ENCOUNTER — Ambulatory Visit
Admission: RE | Admit: 2017-12-03 | Discharge: 2017-12-03 | Disposition: A | Discharge status: Home / Self Care | Payer: 59 | Source: Ambulatory Visit | Attending: Obstetrics and Gynecology | Admitting: Obstetrics and Gynecology

## 2017-12-03 ENCOUNTER — Other Ambulatory Visit: Payer: Self-pay | Admitting: Obstetrics and Gynecology

## 2017-12-03 DIAGNOSIS — N63 Unspecified lump in unspecified breast: Secondary | ICD-10-CM

## 2017-12-03 DIAGNOSIS — N6323 Unspecified lump in the left breast, lower outer quadrant: Secondary | ICD-10-CM | POA: Diagnosis not present

## 2017-12-03 DIAGNOSIS — R928 Other abnormal and inconclusive findings on diagnostic imaging of breast: Secondary | ICD-10-CM

## 2017-12-03 DIAGNOSIS — N6311 Unspecified lump in the right breast, upper outer quadrant: Secondary | ICD-10-CM | POA: Diagnosis not present

## 2017-12-03 DIAGNOSIS — R922 Inconclusive mammogram: Secondary | ICD-10-CM | POA: Diagnosis not present

## 2017-12-09 ENCOUNTER — Telehealth: Payer: Self-pay

## 2017-12-09 NOTE — Telephone Encounter (Signed)
-----   Message from Jeanne Dom, MD sent at 12/04/2017  5:15 PM EST ----- Please have the patient come in so we can do the breast cancer risk calculator for breast cancer. This will determine her risk of breast cancer and possible recommendation for MRI's. Please take off of mammogram hold and put her in recall for 6 months

## 2017-12-09 NOTE — Telephone Encounter (Signed)
Attempted to reach patient at number provided. There was no answer and recording states the voicemail box is full. 04 recall placed. Out of mammogram hold.

## 2017-12-20 NOTE — Telephone Encounter (Signed)
Attempted to reach patient at number provided. There was no answer and recording states the voicemail box is full.

## 2018-01-08 NOTE — Telephone Encounter (Signed)
Dr.Jertson, please advise. Patient has not returned call to the office x 2. Unable to leave voicemail.

## 2018-01-11 NOTE — Telephone Encounter (Signed)
Please send her a letter

## 2018-01-13 ENCOUNTER — Telehealth: Payer: Self-pay | Admitting: *Deleted

## 2018-01-13 NOTE — Telephone Encounter (Signed)
Detailed message left on mobile number per DPR encouraging patient to complete cologuard or to return call to office and update if she has decided not to complete.

## 2018-01-13 NOTE — Telephone Encounter (Signed)
Letter written and to Dr.Jertson for review. 

## 2018-02-10 ENCOUNTER — Other Ambulatory Visit: Payer: 59

## 2018-02-10 DIAGNOSIS — R7989 Other specified abnormal findings of blood chemistry: Secondary | ICD-10-CM | POA: Diagnosis not present

## 2018-02-11 DIAGNOSIS — R7989 Other specified abnormal findings of blood chemistry: Secondary | ICD-10-CM

## 2018-02-11 LAB — CREATININE, SERUM
CREATININE: 0.92 mg/dL (ref 0.57–1.00)
GFR calc Af Amer: 80 mL/min/{1.73_m2} (ref >59)
GFR, EST NON AFRICAN AMERICAN: 70 mL/min/{1.73_m2} (ref >59)

## 2018-06-04 ENCOUNTER — Ambulatory Visit
Admission: RE | Admit: 2018-06-04 | Discharge: 2018-06-04 | Disposition: A | Discharge status: Home / Self Care | Payer: 59 | Source: Ambulatory Visit | Attending: Obstetrics and Gynecology | Admitting: Obstetrics and Gynecology

## 2018-06-04 ENCOUNTER — Other Ambulatory Visit: Payer: Self-pay | Admitting: Obstetrics and Gynecology

## 2018-06-04 DIAGNOSIS — R922 Inconclusive mammogram: Secondary | ICD-10-CM | POA: Diagnosis not present

## 2018-06-04 DIAGNOSIS — N63 Unspecified lump in unspecified breast: Secondary | ICD-10-CM

## 2018-06-04 DIAGNOSIS — N6310 Unspecified lump in the right breast, unspecified quadrant: Secondary | ICD-10-CM | POA: Diagnosis not present

## 2018-06-05 ENCOUNTER — Other Ambulatory Visit: Payer: Self-pay | Admitting: Obstetrics and Gynecology

## 2018-07-24 NOTE — Progress Notes (Signed)
Jeanne Parks is a 57 y.o. female is here to [facility].   Patient Care Team: Briscoe Deutscher, DO as PCP - General (Family Medicine) [name], MD as Consulting Physician (Obstetrics and Gynecology)   History of Present Illness:   HPI:   1. Insulin resistance.   Lab Results  Component Value Date   HGBA1C 5.8 (H) 11/06/2017   Wt Readings from Last 3 Encounters:  07/25/18 194 lb 12.8 oz (88.4 kg)  11/06/17 196 lb (88.9 kg)  10/31/16 197 lb (89.4 kg)   Drinks 36 ounces of water each morning. Drinks 32 ounces sweet tea at lunch. Drinks water for the rest of the day until after dinner when she has a glass of wine.    2. Pure hypercholesterolemia.   Trying to exercise on a regular basis? No, has stationary bike and total body gym at home but does not find much time to use them. Compliant with diet? No, describes herself as a "meat and potatoes girl."   Lipids:    Component Value Date/Time   CHOL 196 11/06/2017 0859   TRIG 115 11/06/2017 0859   HDL 45 11/06/2017 0859   VLDL 21 10/31/2016 1621   CHOLHDL 4.4 11/06/2017 0859   CHOLHDL 4.0 10/31/2016 1621     3. Obesity (BMI 30-39.9). Gradual increase in weight over the last few years. Taking care of husband (renal transplant).    4. Primary insomnia. Has trouble sleeping with husband in the bed. He is always cold so snuggles and she is very hot natured.  Goes to sleep while watching TV for a few hours before going up to bed.    Health Maintenance Due  Topic Date Due  . Hepatitis C Screening  29-Nov-1960  . HIV Screening  01/13/1977  . COLONOSCOPY  01/14/2012   Depression screen PHQ 2/9 07/25/2018  Decreased Interest 0  Down, Depressed, Hopeless 0  PHQ - 2 Score 0   PMHx, SurgHx, SocialHx, Medications, and Allergies were reviewed in the Visit Navigator and updated as appropriate.   Past Medical History:  Diagnosis Date  . HLD (hyperlipidemia)   . HSV-1 infection   . Insulin resistance   . OA  (osteoarthritis) of right knee, s/p arthroscopy      Past Surgical History:  Procedure Laterality Date  . BREAST CYST EXCISION Left   . CESAREAN SECTION  08/27/86  . DILATION AND CURETTAGE OF UTERUS  1997   Miscarriage  . KNEE ARTHROSCOPY Right 02/2015     Family History  Problem Relation Age of Onset  . Breast cancer Mother 64       lumpectomy and radiation; estrogen fed  . Heart disease Father   . Diabetes type I Sister 33  . Lung cancer Maternal Aunt   . Cancer Paternal Uncle     Social History   Tobacco Use  . Smoking status: Never Smoker  . Smokeless tobacco: Never Used  Substance Use Topics  . Alcohol use: Yes    Alcohol/week: 1.0 - 2.0 standard drinks    Types: 1 - 2 Standard drinks or equivalent per week  . Drug use: No   Current Medications and Allergies:   .  Biotin 10 MG TABS, Take 1 tablet by mouth daily., Disp: , Rfl:  .  cetirizine (ZYRTEC) 10 MG chewable tablet, Chew 10 mg by mouth daily., Disp: , Rfl:  .  cholecalciferol (VITAMIN D) 1000 units tablet, Take 1,000 Units by mouth daily., Disp: , Rfl:  .  hydrocortisone (ANUSOL-HC) 2.5 % rectal cream, Apply topically BID prn, Disp: 30 g, Rfl: 1 .  ibuprofen (ADVIL,MOTRIN) 800 MG tablet, Take 800 mg by mouth daily., Disp: , Rfl:  .  lidocaine (XYLOCAINE) 5 % ointment, Apply 1 application topically 3 (three) times daily. Prn, Disp: 30 g, Rfl: 0 .  nystatin cream (MYCOSTATIN), APPLY TO AFFECTED AREA TWICE A DAY FOR UPTO 7 DAYS, Disp: 30 g, Rfl: 1 .  valACYclovir (VALTREX) 500 MG tablet, 1 tab po BID x 3 days prn, Disp: 30 tablet, Rfl: 3 .  metFORMIN (GLUCOPHAGE XR) 750 MG 24 hr tablet, Take 1 tablet (750 mg total) by mouth 2 (two) times daily., Disp: 60 tablet, Rfl: 2 .  traZODone (DESYREL) 50 MG tablet, Take 0.5-1 tablets (25-50 mg total) by mouth at bedtime as needed for sleep., Disp: 30 tablet, Rfl: 3  No Known Allergies   Review of Systems:   Pertinent items are noted in the HPI. Otherwise, ROS is  negative.  Vitals:   Vitals:   07/25/18 0830  BP: 126/86  Pulse: (!) 103  Temp: 97.9 F (36.6 C)  TempSrc: Oral  SpO2: 98%  Weight: 194 lb 12.8 oz (88.4 kg)  Height: 5' 2.5" (1.588 m)     Body mass index is 35.06 kg/m.  Physical Exam:   Physical Exam  Constitutional: She appears well-nourished.  HENT:  Head: Normocephalic and atraumatic.  Eyes: Pupils are equal, round, and reactive to light. EOM are normal.  Neck: Normal range of motion. Neck supple.  Cardiovascular: Normal rate, regular rhythm, normal heart sounds and intact distal pulses.  Pulmonary/Chest: Effort normal.  Abdominal: Soft.  Skin: Skin is warm.  Psychiatric: She has a normal mood and affect. Her behavior is normal.  Nursing note and vitals reviewed.  Assessment and Plan:   Jeanne Parks was seen today for establish care.  Diagnoses and all orders for this visit:  Insulin resistance -     metFORMIN (GLUCOPHAGE XR) 750 MG 24 hr tablet; Take 1 tablet (750 mg total) by mouth 2 (two) times daily.  Pure hypercholesterolemia  Obesity (BMI 30-39.9) Comments: Discussed weaning daily sweet tea.   Primary insomnia -     traZODone (DESYREL) 50 MG tablet; Take 0.5-1 tablets (25-50 mg total) by mouth at bedtime as needed for sleep.   . Reviewed expectations re: course of current medical issues. . Discussed self-management of symptoms. . Outlined signs and symptoms indicating need for more acute intervention. . Patient verbalized understanding and all questions were answered. Marland Kitchen Health Maintenance issues including appropriate healthy diet, exercise, and smoking avoidance were discussed with patient. . See orders for this visit as documented in the electronic medical record. . Patient received an After Visit Summary.  Briscoe Deutscher, DO [place], [facility] 07/25/2018

## 2018-07-25 ENCOUNTER — Ambulatory Visit (INDEPENDENT_AMBULATORY_CARE_PROVIDER_SITE_OTHER): Payer: 59 | Admitting: Family Medicine

## 2018-07-25 ENCOUNTER — Encounter: Payer: Self-pay | Admitting: Family Medicine

## 2018-07-25 VITALS — BP 126/86 | HR 103 | Temp 97.9°F | Ht 62.5 in | Wt 194.8 lb

## 2018-07-25 DIAGNOSIS — E8881 Metabolic syndrome: Secondary | ICD-10-CM | POA: Diagnosis not present

## 2018-07-25 DIAGNOSIS — E7800 Pure hypercholesterolemia, unspecified: Secondary | ICD-10-CM

## 2018-07-25 DIAGNOSIS — F5101 Primary insomnia: Secondary | ICD-10-CM

## 2018-07-25 DIAGNOSIS — E669 Obesity, unspecified: Secondary | ICD-10-CM | POA: Diagnosis not present

## 2018-07-25 DIAGNOSIS — E88819 Insulin resistance, unspecified: Secondary | ICD-10-CM

## 2018-07-25 MED ORDER — TRAZODONE HCL 50 MG PO TABS: 25.0000 mg | ORAL_TABLET | Freq: Every evening | ORAL | 3 refills | Status: DC | PRN

## 2018-07-25 MED ORDER — METFORMIN HCL ER 750 MG PO TB24: 750.0000 mg | ORAL_TABLET | Freq: Two times a day (BID) | ORAL | 2 refills | Status: DC

## 2018-08-04 ENCOUNTER — Encounter: Payer: Self-pay | Admitting: Surgical

## 2018-08-18 NOTE — Progress Notes (Signed)
Jeanne Parks is a 57 y.o. female is here for follow up.  History of Present Illness:   HPI:   Insulin resistance Started on metformin at last office visit.Had some loose stools when she first started but not having any problems with it now. Last A1C 11/06/17 was 5.8.   Pure hypercholesterolemia Trying to exercise on a regular basis? [x]   YES  []   NO Compliant with diet? [x]   YES  []    Patient has lost 4lbs from last visit    Lipids:    Component Value Date/Time   CHOL 196 11/06/2017 0859   TRIG 115 11/06/2017 0859   HDL 45 11/06/2017 0859   VLDL 21 10/31/2016 1621   CHOLHDL 4.4 11/06/2017 0859   CHOLHDL 4.0 10/31/2016 1621   Obesity (BMI 30-39.9)  Patient was going to work on decreasing her sweet tea after last appointment. She has done will with that. Is mainly drinking water at this point.     Primary insomnia Patient was started on Trazodone by mouth at bedtime as needed for sleep. She is only taking 1/2 tab at night. She has noticed improvement but is still not sleeping all night.   Health Maintenance Due  Topic Date Due  . Fecal DNA (Cologuard)  01/14/2012   Depression screen PHQ 2/9 07/25/2018  Decreased Interest 0  Down, Depressed, Hopeless 0  PHQ - 2 Score 0   PMHx, SurgHx, SocialHx, FamHx, Medications, and Allergies were reviewed in the Visit Navigator and updated as appropriate.   Patient Active Problem List   Diagnosis Date Noted  . Pure hypercholesterolemia 08/20/2018  . Insulin resistance 08/20/2018  . Obesity (BMI 30-39.9) 08/20/2018  . Primary insomnia 08/20/2018  . OA (osteoarthritis) of right knee, s/p arthroscopy   . Seasonal allergies    Social History   Tobacco Use  . Smoking status: Never Smoker  . Smokeless tobacco: Never Used  Substance Use Topics  . Alcohol use: Yes    Alcohol/week: 1.0 - 2.0 standard drinks    Types: 1 - 2 Standard drinks or equivalent per week  . Drug use: No   Current Medications and Allergies:   .   Biotin 10 MG TABS, Take 1 tablet by mouth daily., Disp: , Rfl:  .  cetirizine (ZYRTEC) 10 MG chewable tablet, Chew 10 mg by mouth daily., Disp: , Rfl:  .  cholecalciferol (VITAMIN D) 1000 units tablet, Take 1,000 Units by mouth daily., Disp: , Rfl:  .  hydrocortisone (ANUSOL-HC) 2.5 % rectal cream, Apply topically BID prn, Disp: 30 g, Rfl: 1 .  ibuprofen (ADVIL,MOTRIN) 800 MG tablet, Take 800 mg by mouth daily., Disp: , Rfl:  .  lidocaine (XYLOCAINE) 5 % ointment, Apply 1 application topically 3 (three) times daily. Prn, Disp: 30 g, Rfl: 0 .  metFORMIN (GLUCOPHAGE XR) 750 MG 24 hr tablet, Take 1 tablet (750 mg total) by mouth 2 (two) times daily., Disp: 60 tablet, Rfl: 2 .  nystatin cream (MYCOSTATIN), APPLY TO AFFECTED AREA TWICE A DAY FOR UPTO 7 DAYS, Disp: 30 g, Rfl: 1 .  traZODone (DESYREL) 50 MG tablet, Take 0.5-1 tablets (25-50 mg total) by mouth at bedtime as needed for sleep., Disp: 30 tablet, Rfl: 3 .  valACYclovir (VALTREX) 500 MG tablet, 1 tab po BID x 3 days prn, Disp: 30 tablet, Rfl: 3  No Known Allergies   Review of Systems   Pertinent items are noted in the HPI. Otherwise, ROS is negative.  Vitals:  Vitals:   08/20/18 0828  BP: 138/68  Pulse: (!) 108  Temp: 97.8 F (36.6 C)  TempSrc: Oral  SpO2: 98%  Weight: 190 lb 12.8 oz (86.5 kg)  Height: 5' 2.5" (1.588 m)     Body mass index is 34.34 kg/m.  Physical Exam:   Physical Exam  Constitutional: She appears well-nourished.  HENT:  Head: Normocephalic and atraumatic.  Eyes: Pupils are equal, round, and reactive to light. EOM are normal.  Neck: Normal range of motion. Neck supple.  Cardiovascular: Normal rate, regular rhythm, normal heart sounds and intact distal pulses.  Pulmonary/Chest: Effort normal.  Abdominal: Soft.  Skin: Skin is warm.  Psychiatric: She has a normal mood and affect. Her behavior is normal.  Nursing note and vitals reviewed.  Assessment and Plan:   Insulin resistance Lab Results    Component Value Date   HGBA1C 5.5 08/20/2018   Plan: 1. Jeanne Parks will continue to work on weight loss, exercise, and decreasing simple carbohydrates in her diet to help decrease the risk of diabetes.  2. Medication: Metformin.  Seasonal allergies Worsened condition. Reviewed safe symptomatic care and red flags for following up.  Pure hypercholesterolemia Trying to exercise on a regular basis? Yes. Compliant with diet? Yes.  Lab Results  Component Value Date   CHOL 196 11/06/2017   HDL 45 11/06/2017   LDLCALC 128 (H) 11/06/2017   TRIG 115 11/06/2017   CHOLHDL 4.4 11/06/2017   Lab Results  Component Value Date   ALT 22 11/06/2017   AST 21 11/06/2017   ALKPHOS 128 (H) 11/06/2017   BILITOT 0.4 11/06/2017   The 10-year ASCVD risk score Mikey Bussing DC Jr., et al., 2013) is: 3%   Values used to calculate the score:     Age: 29 years     Sex: Female     Is Non-Hispanic African American: No     Diabetic: No     Tobacco smoker: No     Systolic Blood Pressure: 144 mmHg     Is BP treated: No     HDL Cholesterol: 45 mg/dL     Total Cholesterol: 196 mg/dL  Dyslipidemia under fair control.  Plan: 1. Continue dietary measures. 2. Continue regular exercise, an average 40 minutes of moderate to vigorous-intensity aerobic activity 3 or 4 times per week. 3. Lipid-lowering medications: None at present. Will consider if LDL > 190 on recheck..   Primary insomnia Associated symptoms include daytime somnolence. The patient has been taking: Trazodone 25 mg q hs. Side effects from the medication: none.   Plan: 3. Discussed sleep hygiene measures including regular sleep schedule, optimal sleep environment, and relaxing presleep rituals. 4. Avoid daytime naps. 5. Avoid caffeine after noon. 6. Avoid excess alcohol. 7. Avoid tobacco. 8. Recommended daily exercise. 9. Scientist, clinical (histocompatibility and immunogenetics) distributed. 10. Prescription provided today: Increase Trazodone to 50 mg po q hs.  Obesity (BMI  30-39.9) Wt Readings from Last 3 Encounters:  08/20/18 190 lb 12.8 oz (86.5 kg)  07/25/18 194 lb 12.8 oz (88.4 kg)  11/06/17 196 lb (88.9 kg)   Plan: Improving. The patient is asked to make an attempt to improve diet and exercise patterns to aid in medical management of this problem. Recheck at next visit.   Orders Placed This Encounter  Procedures  . Flu Vaccine QUAD 6+ mos PF IM (Fluarix Quad PF)  . Hemoglobin A1c  . POCT HgB A1C   No orders of the defined types were placed in this encounter.  . Reviewed  expectations re: course of current medical issues. . Discussed self-management of symptoms. . Outlined signs and symptoms indicating need for more acute intervention. . Patient verbalized understanding and all questions were answered. Marland Kitchen Health Maintenance issues including appropriate healthy diet, exercise, and smoking avoidance were discussed with patient. . See orders for this visit as documented in the electronic medical record. . Patient received an After Visit Summary.  Briscoe Deutscher, DO [place], [facility] 08/21/2018

## 2018-08-20 ENCOUNTER — Encounter: Payer: Self-pay | Admitting: Family Medicine

## 2018-08-20 ENCOUNTER — Ambulatory Visit (INDEPENDENT_AMBULATORY_CARE_PROVIDER_SITE_OTHER): Payer: 59 | Admitting: Family Medicine

## 2018-08-20 VITALS — BP 138/68 | HR 108 | Temp 97.8°F | Ht 62.5 in | Wt 190.8 lb

## 2018-08-20 DIAGNOSIS — E669 Obesity, unspecified: Secondary | ICD-10-CM | POA: Diagnosis not present

## 2018-08-20 DIAGNOSIS — J302 Other seasonal allergic rhinitis: Secondary | ICD-10-CM

## 2018-08-20 DIAGNOSIS — E8881 Metabolic syndrome: Secondary | ICD-10-CM | POA: Diagnosis not present

## 2018-08-20 DIAGNOSIS — E7800 Pure hypercholesterolemia, unspecified: Secondary | ICD-10-CM | POA: Insufficient documentation

## 2018-08-20 DIAGNOSIS — M179 Osteoarthritis of knee, unspecified: Secondary | ICD-10-CM | POA: Insufficient documentation

## 2018-08-20 DIAGNOSIS — Z23 Encounter for immunization: Secondary | ICD-10-CM | POA: Diagnosis not present

## 2018-08-20 DIAGNOSIS — F5101 Primary insomnia: Secondary | ICD-10-CM | POA: Diagnosis not present

## 2018-08-20 DIAGNOSIS — M1710 Unilateral primary osteoarthritis, unspecified knee: Secondary | ICD-10-CM

## 2018-08-20 DIAGNOSIS — E88819 Insulin resistance, unspecified: Secondary | ICD-10-CM | POA: Insufficient documentation

## 2018-08-20 LAB — POCT GLYCOSYLATED HEMOGLOBIN (HGB A1C): Hemoglobin A1C: 5.5 % (ref 4.0–5.6)

## 2018-08-20 NOTE — Assessment & Plan Note (Signed)
Lab Results  Component Value Date   HGBA1C 5.5 08/20/2018   Plan: 1. Jeanne Parks will continue to work on weight loss, exercise, and decreasing simple carbohydrates in her diet to help decrease the risk of diabetes.  2. Medication: Metformin.

## 2018-08-20 NOTE — Patient Instructions (Signed)
Great job!  Increase the Trazodone to 50 mg each night. Call about the Cologaurd. Try AFRIN on a QTIP for nose bleeds.

## 2018-08-21 NOTE — Assessment & Plan Note (Signed)
Trying to exercise on a regular basis? Yes. Compliant with diet? Yes.  Lab Results  Component Value Date   CHOL 196 11/06/2017   HDL 45 11/06/2017   LDLCALC 128 (H) 11/06/2017   TRIG 115 11/06/2017   CHOLHDL 4.4 11/06/2017   Lab Results  Component Value Date   ALT 22 11/06/2017   AST 21 11/06/2017   ALKPHOS 128 (H) 11/06/2017   BILITOT 0.4 11/06/2017   The 10-year ASCVD risk score Mikey Bussing DC Jr., et al., 2013) is: 3%   Values used to calculate the score:     Age: 57 years     Sex: Female     Is Non-Hispanic African American: No     Diabetic: No     Tobacco smoker: No     Systolic Blood Pressure: 099 mmHg     Is BP treated: No     HDL Cholesterol: 45 mg/dL     Total Cholesterol: 196 mg/dL  Dyslipidemia under fair control.  Plan: 1. Continue dietary measures. 2. Continue regular exercise, an average 40 minutes of moderate to vigorous-intensity aerobic activity 3 or 4 times per week. 3. Lipid-lowering medications: None at present. Will consider if LDL > 190 on recheck.Marland Kitchen

## 2018-08-21 NOTE — Assessment & Plan Note (Signed)
Wt Readings from Last 3 Encounters:  08/20/18 190 lb 12.8 oz (86.5 kg)  07/25/18 194 lb 12.8 oz (88.4 kg)  11/06/17 196 lb (88.9 kg)   Plan: Improving. The patient is asked to make an attempt to improve diet and exercise patterns to aid in medical management of this problem. Recheck at next visit.

## 2018-08-21 NOTE — Assessment & Plan Note (Signed)
Worsened condition. Reviewed safe symptomatic care and red flags for following up.

## 2018-08-21 NOTE — Assessment & Plan Note (Signed)
Associated symptoms include daytime somnolence. The patient has been taking: Trazodone 25 mg q hs. Side effects from the medication: none.   Plan: 1. Discussed sleep hygiene measures including regular sleep schedule, optimal sleep environment, and relaxing presleep rituals. 2. Avoid daytime naps. 3. Avoid caffeine after noon. 4. Avoid excess alcohol. 5. Avoid tobacco. 6. Recommended daily exercise. 7. Scientist, clinical (histocompatibility and immunogenetics) distributed. 8. Prescription provided today: Increase Trazodone to 50 mg po q hs.

## 2018-09-16 ENCOUNTER — Other Ambulatory Visit: Payer: Self-pay | Admitting: Family Medicine

## 2018-09-16 DIAGNOSIS — E8881 Metabolic syndrome: Secondary | ICD-10-CM

## 2018-09-16 DIAGNOSIS — F5101 Primary insomnia: Secondary | ICD-10-CM

## 2018-09-16 NOTE — Telephone Encounter (Signed)
Last OV 08/20/18 Last refil 07/25/18 #30/3 Next OV 11/18/18

## 2018-10-10 ENCOUNTER — Other Ambulatory Visit: Payer: Self-pay | Admitting: Family Medicine

## 2018-10-10 DIAGNOSIS — E8881 Metabolic syndrome: Secondary | ICD-10-CM

## 2018-11-18 ENCOUNTER — Other Ambulatory Visit: Payer: Self-pay | Admitting: Family Medicine

## 2018-11-18 ENCOUNTER — Encounter: Payer: Self-pay | Admitting: Family Medicine

## 2018-11-18 ENCOUNTER — Ambulatory Visit (INDEPENDENT_AMBULATORY_CARE_PROVIDER_SITE_OTHER): Payer: 59 | Admitting: Family Medicine

## 2018-11-18 VITALS — BP 130/74 | HR 110 | Temp 98.1°F | Ht 63.0 in | Wt 182.0 lb

## 2018-11-18 DIAGNOSIS — E7800 Pure hypercholesterolemia, unspecified: Secondary | ICD-10-CM | POA: Diagnosis not present

## 2018-11-18 DIAGNOSIS — F4323 Adjustment disorder with mixed anxiety and depressed mood: Secondary | ICD-10-CM

## 2018-11-18 DIAGNOSIS — E669 Obesity, unspecified: Secondary | ICD-10-CM

## 2018-11-18 DIAGNOSIS — E8881 Metabolic syndrome: Secondary | ICD-10-CM | POA: Diagnosis not present

## 2018-11-18 DIAGNOSIS — F5101 Primary insomnia: Secondary | ICD-10-CM | POA: Diagnosis not present

## 2018-11-18 DIAGNOSIS — E88819 Insulin resistance, unspecified: Secondary | ICD-10-CM

## 2018-11-18 MED ORDER — ALPRAZOLAM 0.5 MG PO TABS: 0.5000 mg | ORAL_TABLET | Freq: Two times a day (BID) | ORAL | 0 refills | Status: DC | PRN

## 2018-11-18 MED ORDER — FLUOXETINE HCL 20 MG PO TABS: 20.0000 mg | ORAL_TABLET | Freq: Every day | ORAL | 3 refills | Status: DC

## 2018-11-18 NOTE — Progress Notes (Signed)
Jeanne Parks is a 58 y.o. female is here for follow up.  History of Present Illness:   Lonell Grandchild, CMA acting as scribe for Dr. Briscoe Deutscher.   HPI:   1. Insulin resistance  Patient taking Metformin with no problems. Down another 9 pounds since last visit.    2. Pure hypercholesterolemia  Patient is not currently on any medications. She watches what she eats.   Lab Results  Component Value Date   CHOL 196 11/06/2017   HDL 45 11/06/2017   LDLCALC 128 (H) 11/06/2017   TRIG 115 11/06/2017   CHOLHDL 4.4 11/06/2017   Lab Results  Component Value Date   ALT 22 11/06/2017   AST 21 11/06/2017   ALKPHOS 128 (H) 11/06/2017   BILITOT 0.4 11/06/2017      3. Primary insomnia   Symptoms include: has difficulty falling asleep and has interrupted sleep. The patient has been taking: trazadone. Side effects from the medication: none.    4. Obesity (BMI 30-39.9)   Usual eating pattern includes: The patient eats a regular, healthy diet.. Usual physical activity includes Has not been able to exercise much due to husbands medical issues . Current life stressors: family issues.   Health Maintenance Due  Topic Date Due  . Fecal DNA (Cologuard)  01/14/2012  . PAP SMEAR-Modifier  09/27/2018   Depression screen [facility] 2/9 11/18/2018 07/25/2018  Decreased Interest 1 0  Down, Depressed, Hopeless 1 0  PHQ - 2 Score 2 0  Altered sleeping 3 -  Tired, decreased energy 0 -  Change in appetite 0 -  Feeling bad or failure about yourself  0 -  Trouble concentrating 0 -  Moving slowly or fidgety/restless 0 -  Suicidal thoughts 0 -  PHQ-9 Score 5 -  Difficult doing work/chores Not difficult at all -   PMHx, SurgHx, SocialHx, FamHx, Medications, and Allergies were reviewed in the Visit Navigator and updated as appropriate.   Patient Active Problem List   Diagnosis Date Noted  . Pure hypercholesterolemia 08/20/2018  . Insulin resistance 08/20/2018  . Obesity (BMI 30-39.9) 08/20/2018  .  Primary insomnia 08/20/2018  . OA (osteoarthritis) of right knee, s/p arthroscopy   . Seasonal allergies    Social History   Tobacco Use  . Smoking status: Never Smoker  . Smokeless tobacco: Never Used  Substance Use Topics  . Alcohol use: Yes    Alcohol/week: 1.0 - 2.0 standard drinks    Types: 1 - 2 Standard drinks or equivalent per week  . Drug use: No   Current Medications and Allergies   Current Outpatient Medications:  .  Biotin 10 MG TABS, Take 1 tablet by mouth daily., Disp: , Rfl:  .  cetirizine (ZYRTEC) 10 MG chewable tablet, Chew 10 mg by mouth daily., Disp: , Rfl:  .  cholecalciferol (VITAMIN D) 1000 units tablet, Take 1,000 Units by mouth daily., Disp: , Rfl:  .  hydrocortisone (ANUSOL-HC) 2.5 % rectal cream, Apply topically BID prn, Disp: 30 g, Rfl: 1 .  ibuprofen (ADVIL,MOTRIN) 800 MG tablet, Take 800 mg by mouth daily., Disp: , Rfl:  .  lidocaine (XYLOCAINE) 5 % ointment, Apply 1 application topically 3 (three) times daily. Prn, Disp: 30 g, Rfl: 0 .  metFORMIN (GLUCOPHAGE-XR) 750 MG 24 hr tablet, TAKE 1 TABLET (750 MG TOTAL) BY MOUTH 2 (TWO) TIMES DAILY., Disp: 60 tablet, Rfl: 2 .  nystatin cream (MYCOSTATIN), APPLY TO AFFECTED AREA TWICE A DAY FOR UPTO 7 DAYS, Disp:  30 g, Rfl: 1 .  traZODone (DESYREL) 50 MG tablet, TAKE 1/2 TO 1 TABLET (25-50 MG TOTAL) BY MOUTH AT BEDTIME AS NEEDED FOR SLEEP., Disp: 90 tablet, Rfl: 1 .  valACYclovir (VALTREX) 500 MG tablet, 1 tab po BID x 3 days prn, Disp: 30 tablet, Rfl: 3  No Known Allergies   Review of Systems   Pertinent items are noted in the HPI. Otherwise, a complete ROS is negative.  Vitals   Vitals:   11/18/18 0811  BP: 130/74  Pulse: (!) 110  Temp: 98.1 F (36.7 C)  TempSrc: Oral  SpO2: 96%  Weight: 182 lb (82.6 kg)  Height: 5\' 3"  (1.6 m)     Body mass index is 32.24 kg/m.  Physical Exam   Physical Exam Vitals signs and nursing note reviewed.  HENT:     Head: Normocephalic and atraumatic.  Eyes:      Pupils: Pupils are equal, round, and reactive to light.  Neck:     Musculoskeletal: Normal range of motion and neck supple.  Cardiovascular:     Rate and Rhythm: Normal rate and regular rhythm.     Heart sounds: Normal heart sounds.  Pulmonary:     Effort: Pulmonary effort is normal.  Abdominal:     Palpations: Abdomen is soft.  Skin:    General: Skin is warm.  Psychiatric:        Behavior: Behavior normal.     Results for orders placed or performed in visit on 08/20/18  POCT HgB A1C  Result Value Ref Range   Hemoglobin A1C 5.5 4.0 - 5.6 %    Assessment and Plan   [place] was seen today for follow-up.  Diagnoses and all orders for this visit:  Insulin resistance  Pure hypercholesterolemia  Primary insomnia  Obesity (BMI 30-39.9)  Situational mixed anxiety and depressive disorder -     ALPRAZolam (XANAX) 0.5 MG tablet; Take 1 tablet (0.5 mg total) by mouth 2 (two) times daily as needed for anxiety. - FLUoxetine (PROZAC) 20 MG tablet; Take 1 tablet (20 mg total) by mouth daily.  . Orders and follow up as documented in [place], reviewed diet, exercise and weight control, cardiovascular risk and specific lipid/LDL goals reviewed, reviewed medications and side effects in detail.  . Reviewed expectations re: course of current medical issues. . Outlined signs and symptoms indicating need for more acute intervention. . Patient verbalized understanding and all questions were answered. . Patient received an After Visit Summary.  CMA served as Education administrator during this visit. History, Physical, and Plan performed by medical provider. The above documentation has been reviewed and is accurate and complete. Briscoe Deutscher, D.O.  Briscoe Deutscher, DO [place], [facility] 11/18/2018

## 2018-11-20 ENCOUNTER — Other Ambulatory Visit: Payer: Self-pay

## 2018-11-20 ENCOUNTER — Telehealth: Payer: Self-pay | Admitting: Family Medicine

## 2018-11-20 MED ORDER — FLUOXETINE HCL 20 MG PO CAPS: ORAL_CAPSULE | ORAL | 3 refills | Status: DC

## 2018-11-20 NOTE — Telephone Encounter (Signed)
Copied from [place] [id number]. Topic: Quick Communication - See Telephone Encounter >> Nov 20, 2018  9:14 AM Sheran Luz wrote: CRM for notification. See Telephone encounter for: 11/20/18.  Patient called stating that pharmacy is needing clarification of the medication FLUoxetine (PROZAC) 20 MG capsule specifically directions, refills and quantity. Please advise.  [id number] [address], [place]  [phone] (Phone) [phone] (Fax)

## 2018-11-20 NOTE — Telephone Encounter (Signed)
New script sent with directions of one daily

## 2018-11-20 NOTE — Telephone Encounter (Signed)
[facility] called and spoke to [facility], Education administrator about the refill of Prozac. He says that there needs to be a clarification on the directions to the patient, refills and quantity. No directions on the Rx sent on 11/18/18, see below.  FLUoxetine (PROZAC) 20 MG capsule 30 capsule 3 11/18/2018    Sig: Please specify directions, refills and quantity   Sent to pharmacy as: FLUoxetine (PROZAC) 20 MG capsule   E-Prescribing Status: Receipt confirmed by pharmacy (11/18/2018 9:20 AM EST)

## 2018-11-20 NOTE — Telephone Encounter (Signed)
See note

## 2018-12-01 NOTE — Progress Notes (Signed)
58 y.o. G68P1011 Married White or Caucasian Not Hispanic or Latino female here for annual exam.   No vaginal bleeding, no bowel or bladder changes.  Husbands health has been poor. He will need another kidney transplant. Having heart issues. Very stressful.   She has started seeing Dr Juleen China. She has lost weight. She is on metformin. Trying to eat healthy.     Patient's last menstrual period was 01/06/2014 (approximate).          Sexually active: Yes.    The current method of family planning is post menopausal status.    Exercising: No.  The patient does not participate in regular exercise at present. Smoker:  no  Health Maintenance: Pap:  09-28-15 WNL NEG HR HPV  History of abnormal Pap:  no MMG:  06/04/2018 Birads 3 probably benign, 06/04/2018 right breast ultrasound Birads 3 probably benign, Bilateral diagnostic mammogram and right breast ultrasound due. Colonoscopy:  Never, she has the cologuard.  BMD:   Never TDaP:  10-31-16 Gardasil: N/A   reports that she has never smoked. She has never used smokeless tobacco. She reports current alcohol use of about 2.0 - 3.0 standard drinks of alcohol per week. She reports that she does not use drugs. Husband with h/o kidney transplant, DM. 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 Married x 36 years. Her daughter is married, she has 2 stepchildren (69 and 58).   Past Medical History:  Diagnosis Date  . History of chicken pox   . HLD (hyperlipidemia)   . HSV-1 infection   . Insulin resistance   . OA (osteoarthritis) of right knee, s/p arthroscopy   . Seasonal allergies   . 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 BREAST CYST EXCISION Left   . CESAREAN SECTION  08/27/86  . DILATION AND CURETTAGE OF UTERUS  1997   Miscarriage  . KNEE ARTHROSCOPY Right 02/2015  . TONSILLECTOMY  1967    Current Outpatient Medications  Medication Sig Dispense Refill  . ALPRAZolam (XANAX) 0.5 MG tablet  Take 1 tablet (0.5 mg total) by mouth 2 (two) times daily as needed for anxiety. 30 tablet 0  . Biotin 10 MG TABS Take 1 tablet by mouth daily.    . cetirizine (ZYRTEC) 10 MG chewable tablet Chew 10 mg by mouth daily.    . cholecalciferol (VITAMIN D) 1000 units tablet Take 1,000 Units by mouth daily.    Marland Kitchen FLUoxetine (PROZAC) 20 MG capsule 1 daily 30 capsule 3  . hydrocortisone (ANUSOL-HC) 2.5 % rectal cream Apply topically BID prn 30 g 1  . ibuprofen (ADVIL,MOTRIN) 800 MG tablet Take 800 mg by mouth daily.    Marland Kitchen lidocaine (XYLOCAINE) 5 % ointment Apply 1 application topically 3 (three) times daily. Prn 30 g 0  . metFORMIN (GLUCOPHAGE-XR) 750 MG 24 hr tablet TAKE 1 TABLET (750 MG TOTAL) BY MOUTH 2 (TWO) TIMES DAILY. 60 tablet 2  . nystatin cream (MYCOSTATIN) APPLY TO AFFECTED AREA TWICE A DAY FOR UPTO 7 DAYS 30 g 1  . traZODone (DESYREL) 50 MG tablet TAKE 1/2 TO 1 TABLET (25-50 MG TOTAL) BY MOUTH AT BEDTIME AS NEEDED FOR SLEEP. 90 tablet 1  . valACYclovir (VALTREX) 500 MG tablet 1 tab po BID x 3 days prn 30 tablet 3   No current facility-administered medications for this visit.     Family History  Problem Relation Age of Onset  . Breast cancer Mother 17  lumpectomy and radiation; estrogen fed  . Hypertension Mother   . Heart disease Father   . Diabetes type I Sister 21  . Lung cancer Maternal Aunt   . Cancer Paternal Uncle   . High Cholesterol Sister     Review of Systems  Constitutional: Negative.   HENT: Negative.   Eyes: Negative.   Respiratory: Negative.   Cardiovascular: Negative.   Gastrointestinal: Negative.   Endocrine: Negative.   Genitourinary: Negative.   Musculoskeletal: Negative.   Skin: Negative.   Allergic/Immunologic: Negative.   Neurological: Negative.   Hematological: Negative.   Psychiatric/Behavioral: Negative.     Exam:   BP 128/86 (BP Location: Right Arm, Patient Position: Sitting, Cuff Size: Normal)   Pulse 100   Ht 5' 2.6" (1.59 m)   Wt 184  lb 9.6 oz (83.7 kg)   LMP 01/06/2014 (Approximate)   BMI 33.12 kg/m   Weight change: @WEIGHTCHANGE @ Height:   Height: 5' 2.6" (159 cm)  Ht Readings from Last 3 Encounters:  12/03/18 5' 2.6" (1.59 m)  11/18/18 5\' 3"  (1.6 m)  08/20/18 5' 2.5" (1.588 m)    General appearance: alert, cooperative and appears stated age Head: Normocephalic, without obvious abnormality, atraumatic Neck: no adenopathy, supple, symmetrical, trachea midline and thyroid normal to inspection and palpation Lungs: clear to auscultation bilaterally Cardiovascular: regular rate and rhythm Breasts: normal appearance, no masses or tenderness Abdomen: soft, non-tender; non distended,  no masses,  no organomegaly Extremities: extremities normal, atraumatic, no cyanosis or edema Skin: Skin color, texture, turgor normal. Erythematous rash under her right breast c/w candida intertrigo Lymph nodes: Cervical, supraclavicular, and axillary nodes normal. No abnormal inguinal nodes palpated Neurologic: Grossly normal   Pelvic: External genitalia:  no lesions              Urethra:  normal appearing urethra with no masses, tenderness or lesions              Bartholins and Skenes: normal                 Vagina: normal appearing vagina with normal color and discharge, no lesions              Cervix: no lesions               Bimanual Exam:  Uterus:  normal size, contour, position, consistency, mobility, non-tender              Adnexa: no mass, fullness, tenderness               Rectovaginal: Confirms               Anus:  normal sphincter tone, no lesions  Chaperone was present for exam.  A:  Well Woman with normal exam  H/O genital hsv, infrequent outbreaks  Intermittent issues with candida intertrigo, currently under her right breast  Requests refill of nystatin and lidocaine  P:   Pap with hpv  Discussed breast self exam  Discussed calcium and vit D intake  Labs with primary  Mammogram is scheduled  She has the  cologuard, plans to do it

## 2018-12-03 ENCOUNTER — Other Ambulatory Visit: Payer: Self-pay

## 2018-12-03 ENCOUNTER — Ambulatory Visit (INDEPENDENT_AMBULATORY_CARE_PROVIDER_SITE_OTHER): Payer: 59 | Admitting: Obstetrics and Gynecology

## 2018-12-03 ENCOUNTER — Other Ambulatory Visit (HOSPITAL_COMMUNITY)
Admission: RE | Admit: 2018-12-03 | Discharge: 2018-12-03 | Disposition: A | Discharge status: Home / Self Care | Payer: 59 | Source: Ambulatory Visit | Attending: Obstetrics and Gynecology | Admitting: Obstetrics and Gynecology

## 2018-12-03 ENCOUNTER — Encounter: Payer: Self-pay | Admitting: Obstetrics and Gynecology

## 2018-12-03 VITALS — BP 128/86 | HR 100 | Ht 62.6 in | Wt 184.6 lb

## 2018-12-03 DIAGNOSIS — B372 Candidiasis of skin and nail: Secondary | ICD-10-CM | POA: Diagnosis not present

## 2018-12-03 DIAGNOSIS — Z124 Encounter for screening for malignant neoplasm of cervix: Secondary | ICD-10-CM | POA: Diagnosis present

## 2018-12-03 DIAGNOSIS — Z01419 Encounter for gynecological examination (general) (routine) without abnormal findings: Secondary | ICD-10-CM

## 2018-12-03 MED ORDER — VALACYCLOVIR HCL 500 MG PO TABS: ORAL_TABLET | ORAL | 3 refills | Status: DC

## 2018-12-03 MED ORDER — NYSTATIN 100000 UNIT/GM EX CREA: TOPICAL_CREAM | CUTANEOUS | 1 refills | Status: DC

## 2018-12-03 MED ORDER — LIDOCAINE 5 % EX OINT: 1.0000 "application " | TOPICAL_OINTMENT | Freq: Three times a day (TID) | CUTANEOUS | 0 refills | Status: DC

## 2018-12-03 NOTE — Addendum Note (Signed)
Addended by: Dorothy Spark on: 12/03/2018 08:41 AM   Modules accepted: Orders

## 2018-12-03 NOTE — Patient Instructions (Signed)

## 2018-12-04 LAB — CYTOLOGY - PAP
DIAGNOSIS: NEGATIVE
HPV: NOT DETECTED

## 2018-12-08 ENCOUNTER — Other Ambulatory Visit: Payer: 59

## 2018-12-12 ENCOUNTER — Ambulatory Visit
Admission: RE | Admit: 2018-12-12 | Discharge: 2018-12-12 | Disposition: A | Discharge status: Home / Self Care | Payer: 59 | Source: Ambulatory Visit | Attending: Obstetrics and Gynecology | Admitting: Obstetrics and Gynecology

## 2018-12-12 DIAGNOSIS — N63 Unspecified lump in unspecified breast: Secondary | ICD-10-CM

## 2018-12-12 DIAGNOSIS — N6311 Unspecified lump in the right breast, upper outer quadrant: Secondary | ICD-10-CM | POA: Diagnosis not present

## 2018-12-12 DIAGNOSIS — R922 Inconclusive mammogram: Secondary | ICD-10-CM | POA: Diagnosis not present

## 2018-12-18 ENCOUNTER — Other Ambulatory Visit: Payer: Self-pay | Admitting: Family Medicine

## 2019-01-16 ENCOUNTER — Other Ambulatory Visit: Payer: Self-pay | Admitting: Family Medicine

## 2019-01-16 DIAGNOSIS — E8881 Metabolic syndrome: Secondary | ICD-10-CM

## 2019-02-16 ENCOUNTER — Encounter: Payer: Self-pay | Admitting: Family Medicine

## 2019-02-16 ENCOUNTER — Other Ambulatory Visit: Payer: Self-pay

## 2019-02-16 ENCOUNTER — Ambulatory Visit (INDEPENDENT_AMBULATORY_CARE_PROVIDER_SITE_OTHER): Payer: 59 | Admitting: Family Medicine

## 2019-02-16 VITALS — BP 151/80 | HR 112 | Ht 62.6 in | Wt 177.0 lb

## 2019-02-16 DIAGNOSIS — F5101 Primary insomnia: Secondary | ICD-10-CM | POA: Diagnosis not present

## 2019-02-16 DIAGNOSIS — E1169 Type 2 diabetes mellitus with other specified complication: Secondary | ICD-10-CM | POA: Insufficient documentation

## 2019-02-16 DIAGNOSIS — E669 Obesity, unspecified: Secondary | ICD-10-CM | POA: Diagnosis not present

## 2019-02-16 DIAGNOSIS — E119 Type 2 diabetes mellitus without complications: Secondary | ICD-10-CM | POA: Insufficient documentation

## 2019-02-16 DIAGNOSIS — F4323 Adjustment disorder with mixed anxiety and depressed mood: Secondary | ICD-10-CM | POA: Insufficient documentation

## 2019-02-16 DIAGNOSIS — I152 Hypertension secondary to endocrine disorders: Secondary | ICD-10-CM | POA: Insufficient documentation

## 2019-02-16 DIAGNOSIS — E785 Hyperlipidemia, unspecified: Secondary | ICD-10-CM

## 2019-02-16 DIAGNOSIS — E1159 Type 2 diabetes mellitus with other circulatory complications: Secondary | ICD-10-CM | POA: Insufficient documentation

## 2019-02-16 NOTE — Progress Notes (Signed)
Virtual Visit via Video   Due to the COVID-19 pandemic, this visit was completed with telemedicine (audio/video) technology to reduce patient and provider exposure as well as to preserve personal protective equipment.   I connected with Jeanne Parks by a video enabled telemedicine application and verified that I am speaking with the correct person using two identifiers. Location patient: Home Location provider: [facility], Office Persons participating in the virtual visit: Sophi, Calligan, DO Lonell Grandchild, CMA acting as scribe for Dr. Briscoe Deutscher.   I discussed the limitations of evaluation and management by telemedicine and the availability of in person appointments. The patient expressed understanding and agreed to proceed.  Care Team   Patient Care Team: Briscoe Deutscher, DO as PCP - General (Family Medicine) [name], MD as Consulting Physician (Obstetrics and Gynecology)  Subjective:   HPI: Patient in for follow up. She has not had any issues recently. She has not had to start the Prozac. She is only taking the xanax as needed as well as trazodone 1 1/2 tab nightly. She has been doing well on that. 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 They have mask if needed. She is being very carefull with health due to husband with medical issues.   She is down 5 more lbs from last visit. She is working on increasing exercise and making healthy food choices. She has been using her stationary bike to avoid contact with people. She has been taking Metformin twice a day with no issues.      Review of Systems  Constitutional: Negative for chills and fever.  HENT: Negative for hearing loss and tinnitus.   Eyes: Negative for blurred vision and double vision.  Respiratory: Negative for cough.   Cardiovascular: Negative for chest pain.  Gastrointestinal: Negative for nausea and vomiting.  Genitourinary: Negative for dysuria  and urgency.  Musculoskeletal: Negative for myalgias.  Skin: Negative for rash.  Neurological: Negative for dizziness and headaches.  Endo/Heme/Allergies: Does not bruise/bleed easily.  Psychiatric/Behavioral: Negative for depression and suicidal ideas.    Patient Active Problem List   Diagnosis Date Noted  . Diabetes mellitus ([place]) 02/16/2019  . Hyperlipidemia associated with type 2 diabetes mellitus ([place]) 02/16/2019  . Situational mixed anxiety and depressive disorder 02/16/2019  . Obesity (BMI 30-39.9) 08/20/2018  . Primary insomnia 08/20/2018  . OA (osteoarthritis) of right knee, s/p arthroscopy   . Seasonal allergies     Social History   Tobacco Use  . Smoking status: Never Smoker  . Smokeless tobacco: Never Used  Substance Use Topics  . Alcohol use: Yes    Alcohol/week: 2.0 - 3.0 standard drinks    Types: 2 - 3 Standard drinks or equivalent per week    Current Outpatient Medications:  .  ALPRAZolam (XANAX) 0.5 MG tablet, Take 1 tablet (0.5 mg total) by mouth 2 (two) times daily as needed for anxiety., Disp: 30 tablet, Rfl: 0 .  Biotin 10 MG TABS, Take 1 tablet by mouth daily., Disp: , Rfl:  .  cetirizine (ZYRTEC) 10 MG chewable tablet, Chew 10 mg by mouth daily., Disp: , Rfl:  .  cholecalciferol (VITAMIN D) 1000 units tablet, Take 1,000 Units by mouth daily., Disp: , Rfl:  .  FLUoxetine (PROZAC) 20 MG capsule, TAKE 1 CAPSULE BY MOUTH EVERY DAY, Disp: 90 capsule, Rfl: 2 .  hydrocortisone (ANUSOL-HC) 2.5 % rectal cream, Apply topically BID prn, Disp: 30 g, Rfl: 1 .  ibuprofen (ADVIL,MOTRIN) 800 MG tablet, Take 800 mg by mouth daily., Disp: , Rfl:  .  lidocaine (XYLOCAINE) 5 % ointment, Apply 1 application topically 3 (three) times daily. Prn, Disp: 30 g, Rfl: 0 .  metFORMIN (GLUCOPHAGE-XR) 750 MG 24 hr tablet, TAKE 1 TABLET (750 MG TOTAL) BY MOUTH 2 (TWO) TIMES DAILY., Disp: 180 tablet, Rfl: 0 .  nystatin cream (MYCOSTATIN), APPLY TO AFFECTED AREA TWICE A DAY FOR UPTO 7  DAYS, Disp: 30 g, Rfl: 1 .  traZODone (DESYREL) 50 MG tablet, TAKE 1/2 TO 1 TABLET (25-50 MG TOTAL) BY MOUTH AT BEDTIME AS NEEDED FOR SLEEP., Disp: 90 tablet, Rfl: 1 .  valACYclovir (VALTREX) 500 MG tablet, 1 tab po BID x 3 days prn, Disp: 30 tablet, Rfl: 3  No Known Allergies  Objective:   VITALS: Per patient if applicable, see vitals. GENERAL: Alert, appears well and in no acute distress. HEENT: Atraumatic, conjunctiva clear, no obvious abnormalities on inspection of external nose and ears. NECK: Normal movements of the head and neck. CARDIOPULMONARY: No increased WOB. Speaking in clear sentences. I:E ratio WNL.  MS: Moves all visible extremities without noticeable abnormality. PSYCH: Pleasant and cooperative, well-groomed. Speech normal rate and rhythm. Affect is appropriate. Insight and judgement are appropriate. Attention is focused, linear, and appropriate.  NEURO: CN grossly intact. Oriented as arrived to appointment on time with no prompting. Moves both UE equally.  SKIN: No obvious lesions, wounds, erythema, or cyanosis noted on face or hands.  Depression screen [facility] 2/9 02/16/2019 11/18/2018 07/25/2018  Decreased Interest 0 1 0  Down, Depressed, Hopeless 0 1 0  PHQ - 2 Score 0 2 0  Altered sleeping 0 3 -  Tired, decreased energy 0 0 -  Change in appetite 0 0 -  Feeling bad or failure about yourself  0 0 -  Trouble concentrating 0 0 -  Moving slowly or fidgety/restless 0 0 -  Suicidal thoughts 0 0 -  PHQ-9 Score 0 5 -  Difficult doing work/chores - Not difficult at all -    Assessment and Plan:   [name] was seen today for follow-up.  Diagnoses and all orders for this visit:  Primary insomnia Well controlled.  No signs of complications, medication side effects, or red flags.  Continue current regimen.    Obesity (BMI 30-39.9) Wt Readings from Last 3 Encounters:  02/16/19 177 lb (80.3 kg)  12/03/18 184 lb 9.6 oz (83.7 kg)  11/18/18 182 lb (82.6 kg)   Situational  mixed anxiety and depressive disorder Well controlled.  No signs of complications, medication side effects, or red flags.  Continue current regimen.    Hyperlipidemia associated with type 2 diabetes mellitus ([place]) Well controlled.  No signs of complications, medication side effects, or red flags.  Continue current regimen.    Type 2 diabetes mellitus with other specified complication, without long-term current use of insulin ([facility]) Well controlled.  No signs of complications, medication side effects, or red flags.  Continue current regimen.    Marland Kitchen COVID-19 Education: The signs and symptoms of COVID-19 were discussed with the patient and how to seek care for testing if needed. The importance of social distancing was discussed today. . Reviewed expectations re: course of current medical issues. . Discussed self-management of symptoms. . Outlined signs and symptoms indicating need for more acute intervention. . Patient verbalized understanding and all questions were answered. Marland Kitchen Health Maintenance issues including appropriate healthy diet, exercise, and smoking avoidance were discussed with patient. . See  orders for this visit as documented in the electronic medical record.  Briscoe Deutscher, DO  Records requested if needed. Time spent: 25 minutes, of which >50% was spent in obtaining information about her symptoms, reviewing her previous labs, evaluations, and treatments, counseling her about her condition (please see the discussed topics above), and developing a plan to further investigate it; she had a number of questions which I addressed.

## 2019-03-11 ENCOUNTER — Other Ambulatory Visit: Payer: Self-pay | Admitting: Family Medicine

## 2019-03-11 DIAGNOSIS — F5101 Primary insomnia: Secondary | ICD-10-CM

## 2019-04-16 ENCOUNTER — Other Ambulatory Visit: Payer: Self-pay | Admitting: Family Medicine

## 2019-04-16 DIAGNOSIS — E8881 Metabolic syndrome: Secondary | ICD-10-CM

## 2019-04-27 ENCOUNTER — Telehealth: Payer: Self-pay | Admitting: *Deleted

## 2019-04-27 NOTE — Telephone Encounter (Signed)
Fax received from [employer], cologuard order expired.   Call to patient. Patient does not wish to proceed with Cologuard at this time, plans to further discuss with her PCP in 05/2019.   Routing to provider for final review. Patient is agreeable to disposition. Will close encounter.

## 2019-05-24 NOTE — Progress Notes (Signed)
Subjective:    Jeanne Parks is a 58 y.o. female and is here for a comprehensive physical exam.  Still working on weight loss. Happy about her progress, but would like to know goal.  Hot flashes. LMP > 5 years ago.   Husband recently had umbilical surgery without complications.   Health Maintenance Due  Topic Date Due  . PNEUMOCOCCAL POLYSACCHARIDE VACCINE AGE 42-64 HIGH RISK  01/14/1964  . FOOT EXAM  01/14/1972  . OPHTHALMOLOGY EXAM  01/14/1972  . URINE MICROALBUMIN  01/14/1972  . Fecal DNA (Cologuard)  01/14/2012  . HEMOGLOBIN A1C  02/18/2019     Current Outpatient Medications:  .  ALPRAZolam (XANAX) 0.5 MG tablet, Take 1 tablet (0.5 mg total) by mouth 2 (two) times daily as needed for anxiety., Disp: 30 tablet, Rfl: 0 .  Biotin 10 MG TABS, Take 1 tablet by mouth daily., Disp: , Rfl:  .  cetirizine (ZYRTEC) 10 MG chewable tablet, Chew 10 mg by mouth daily., Disp: , Rfl:  .  cholecalciferol (VITAMIN D) 1000 units tablet, Take 1,000 Units by mouth daily., Disp: , Rfl:  .  hydrocortisone (ANUSOL-HC) 2.5 % rectal cream, Apply topically BID prn, Disp: 30 g, Rfl: 1 .  ibuprofen (ADVIL,MOTRIN) 800 MG tablet, Take 800 mg by mouth daily., Disp: , Rfl:  .  lidocaine (XYLOCAINE) 5 % ointment, Apply 1 application topically 3 (three) times daily. Prn, Disp: 30 g, Rfl: 0 .  metFORMIN (GLUCOPHAGE-XR) 750 MG 24 hr tablet, TAKE 1 TABLET (750 MG TOTAL) BY MOUTH 2 (TWO) TIMES DAILY., Disp: 180 tablet, Rfl: 0 .  nystatin cream (MYCOSTATIN), APPLY TO AFFECTED AREA TWICE A DAY FOR UPTO 7 DAYS, Disp: 30 g, Rfl: 1 .  traZODone (DESYREL) 50 MG tablet, TAKE 1/2 TO 1 TABLET (25-50 MG TOTAL) BY MOUTH AT BEDTIME AS NEEDED FOR SLEEP., Disp: 90 tablet, Rfl: 1 .  valACYclovir (VALTREX) 500 MG tablet, 1 tab po BID x 3 days prn, Disp: 30 tablet, Rfl: 3  PMHx, SurgHx, SocialHx, Medications, and Allergies were reviewed in the Visit Navigator and updated as appropriate.   Past Medical History:  Diagnosis Date   . History of chicken pox   . HLD (hyperlipidemia)   . HSV-1 infection   . Insulin resistance   . OA (osteoarthritis) of right knee, s/p arthroscopy   . Seasonal allergies   . Skin cancer      Past Surgical History:  Procedure Laterality Date  . BREAST CYST EXCISION Left   . CESAREAN SECTION  08/27/86  . DILATION AND CURETTAGE OF UTERUS  1997   Miscarriage  . KNEE ARTHROSCOPY Right 02/2015  . TONSILLECTOMY  1967     Family History  Problem Relation Age of Onset  . Breast cancer Mother 75       lumpectomy and radiation; estrogen fed  . Hypertension Mother   . Heart disease Father   . Diabetes type I Sister 16  . Lung cancer Maternal Aunt   . Cancer Paternal Uncle   . High Cholesterol Sister     Social History   Tobacco Use  . Smoking status: Never Smoker  . Smokeless tobacco: Never Used  Substance Use Topics  . Alcohol use: Yes    Alcohol/week: 2.0 - 3.0 standard drinks    Types: 2 - 3 Standard drinks or equivalent per week  . Drug use: No    Review of Systems:   Pertinent items are noted in the HPI. Otherwise, ROS is negative.  Objective:   BP 140/82   Pulse 88   Temp (!) 96.6 F (35.9 C) (Temporal)   Ht 5' 2.6" (1.59 m)   Wt 179 lb (81.2 kg)   LMP 01/06/2014 (Approximate)   SpO2 98%   BMI 32.11 kg/m   General appearance: alert, cooperative and appears stated age. Head: normocephalic, without obvious abnormality, atraumatic. Neck: no adenopathy, supple, symmetrical, trachea midline; thyroid not enlarged, symmetric, no tenderness/mass/nodules. Lungs: clear to auscultation bilaterally. Heart: regular rate and rhythm Abdomen: soft, non-tender; no masses,  no organomegaly. Extremities: extremities normal, atraumatic, no cyanosis or edema. Skin: skin color, texture, turgor normal, no rashes or lesions. Lymph: cervical, supraclavicular, and axillary nodes normal; no abnormal inguinal nodes palpated. Neurologic: grossly normal.                                       Assessment/Plan:   Jeanne Parks was seen today for annual exam.  Diagnoses and all orders for this visit:  Routine physical examination  Obesity (BMI 30-39.9) Comments: SEE AVS. Goal BMI 30.  Seasonal allergies  Insulin resistance -     Hemoglobin A1c  Pure hypercholesterolemia -     Comprehensive metabolic panel -     Lipid panel  Medication management -     CBC with Differential/Platelet -     Vitamin B12  Vitamin D deficiency -     VITAMIN D 25 Hydroxy (Vit-D Deficiency, Fractures)  Need for pneumococcal vaccination -     Pneumococcal polysaccharide vaccine 23-valent greater than or equal to 2yo subcutaneous/IM  Vasomotor symptoms due to menopause Comments: Discussed Effexor and Gabapentin. She will read about both.   Patient Counseling: [x]    Nutrition: Stressed importance of moderation in sodium/caffeine intake, saturated fat and cholesterol, caloric balance, sufficient intake of fresh fruits, vegetables, fiber, calcium, iron, and 1 mg of folate supplement per day (for females capable of pregnancy).  [x]    Stressed the importance of regular exercise.   [x]    Substance Abuse: Discussed cessation/primary prevention of tobacco, alcohol, or other drug use; driving or other dangerous activities under the influence; availability of treatment for abuse.   [x]    Injury prevention: Discussed safety belts, safety helmets, smoke detector, smoking near bedding or upholstery.   [x]    Sexuality: Discussed sexually transmitted diseases, partner selection, use of condoms, avoidance of unintended pregnancy  and contraceptive alternatives.  [x]    Dental health: Discussed importance of regular tooth brushing, flossing, and dental visits.  [x]    Health maintenance and immunizations reviewed. Please refer to Health maintenance section.   Briscoe Deutscher, DO [place]

## 2019-05-25 ENCOUNTER — Other Ambulatory Visit: Payer: Self-pay

## 2019-05-25 ENCOUNTER — Encounter: Payer: Self-pay | Admitting: Family Medicine

## 2019-05-25 ENCOUNTER — Ambulatory Visit (INDEPENDENT_AMBULATORY_CARE_PROVIDER_SITE_OTHER): Payer: 59 | Admitting: Family Medicine

## 2019-05-25 VITALS — BP 140/82 | HR 88 | Temp 96.6°F | Ht 62.6 in | Wt 179.0 lb

## 2019-05-25 DIAGNOSIS — E7800 Pure hypercholesterolemia, unspecified: Secondary | ICD-10-CM | POA: Diagnosis not present

## 2019-05-25 DIAGNOSIS — E8881 Metabolic syndrome: Secondary | ICD-10-CM | POA: Diagnosis not present

## 2019-05-25 DIAGNOSIS — J302 Other seasonal allergic rhinitis: Secondary | ICD-10-CM | POA: Diagnosis not present

## 2019-05-25 DIAGNOSIS — E669 Obesity, unspecified: Secondary | ICD-10-CM

## 2019-05-25 DIAGNOSIS — Z23 Encounter for immunization: Secondary | ICD-10-CM | POA: Diagnosis not present

## 2019-05-25 DIAGNOSIS — E559 Vitamin D deficiency, unspecified: Secondary | ICD-10-CM | POA: Diagnosis not present

## 2019-05-25 DIAGNOSIS — Z0000 Encounter for general adult medical examination without abnormal findings: Secondary | ICD-10-CM

## 2019-05-25 DIAGNOSIS — Z79899 Other long term (current) drug therapy: Secondary | ICD-10-CM | POA: Diagnosis not present

## 2019-05-25 DIAGNOSIS — N951 Menopausal and female climacteric states: Secondary | ICD-10-CM

## 2019-05-25 DIAGNOSIS — E88819 Insulin resistance, unspecified: Secondary | ICD-10-CM

## 2019-05-25 LAB — LIPID PANEL
Cholesterol: 204 mg/dL — ABNORMAL HIGH (ref 0–200)
HDL: 52.5 mg/dL (ref >39.00)
LDL Cholesterol: 136 mg/dL — ABNORMAL HIGH (ref 0–99)
NonHDL: 151.25
Total CHOL/HDL Ratio: 4
Triglycerides: 75 mg/dL (ref 0.0–149.0)
VLDL: 15 mg/dL (ref 0.0–40.0)

## 2019-05-25 LAB — CBC WITH DIFFERENTIAL/PLATELET
Basophils Absolute: 0 10*3/uL (ref 0.0–0.1)
Basophils Relative: 0.4 % (ref 0.0–3.0)
Eosinophils Absolute: 0 10*3/uL (ref 0.0–0.7)
Eosinophils Relative: 0.4 % (ref 0.0–5.0)
HCT: 41.2 % (ref 36.0–46.0)
Hemoglobin: 13.5 g/dL (ref 12.0–15.0)
Lymphocytes Relative: 16.7 % (ref 12.0–46.0)
Lymphs Abs: 1.3 10*3/uL (ref 0.7–4.0)
MCHC: 32.9 g/dL (ref 30.0–36.0)
MCV: 92.3 fl (ref 78.0–100.0)
Monocytes Absolute: 0.6 10*3/uL (ref 0.1–1.0)
Monocytes Relative: 7.4 % (ref 3.0–12.0)
Neutro Abs: 6 10*3/uL (ref 1.4–7.7)
Neutrophils Relative %: 75.1 % (ref 43.0–77.0)
Platelets: 347 10*3/uL (ref 150.0–400.0)
RBC: 4.47 Mil/uL (ref 3.87–5.11)
RDW: 13.2 % (ref 11.5–15.5)
WBC: 8 10*3/uL (ref 4.0–10.5)

## 2019-05-25 LAB — COMPREHENSIVE METABOLIC PANEL
ALT: 17 U/L (ref 0–35)
AST: 16 U/L (ref 0–37)
Albumin: 4.5 g/dL (ref 3.5–5.2)
Alkaline Phosphatase: 97 U/L (ref 39–117)
BUN: 17 mg/dL (ref 6–23)
CO2: 28 mEq/L (ref 19–32)
Calcium: 10 mg/dL (ref 8.4–10.5)
Chloride: 102 mEq/L (ref 96–112)
Creatinine, Ser: 1.02 mg/dL (ref 0.40–1.20)
GFR: 55.79 mL/min — ABNORMAL LOW (ref >60.00)
Glucose, Bld: 108 mg/dL — ABNORMAL HIGH (ref 70–99)
Potassium: 4.3 mEq/L (ref 3.5–5.1)
Sodium: 138 mEq/L (ref 135–145)
Total Bilirubin: 0.5 mg/dL (ref 0.2–1.2)
Total Protein: 6.8 g/dL (ref 6.0–8.3)

## 2019-05-25 LAB — VITAMIN B12: Vitamin B-12: 218 pg/mL (ref 211–911)

## 2019-05-25 LAB — HEMOGLOBIN A1C: Hgb A1c MFr Bld: 5.9 % (ref 4.6–6.5)

## 2019-05-25 LAB — VITAMIN D 25 HYDROXY (VIT D DEFICIENCY, FRACTURES): VITD: 56.79 ng/mL (ref 30.00–100.00)

## 2019-05-25 NOTE — Patient Instructions (Signed)
Wt Readings from Last 3 Encounters:  05/25/19 179 lb (81.2 kg)  02/16/19 177 lb (80.3 kg)  12/03/18 184 lb 9.6 oz (83.7 kg)   Body mass index is 32.11 kg/m.  Ideal body weight: 51.5 kg (113 lb 7.9 oz) Adjusted ideal body weight: 63.4 kg (139 lb 11.1 oz)  I want you to focus on strengthening and mobility now. You have done an amazing job!

## 2019-07-09 ENCOUNTER — Encounter: Payer: 59 | Admitting: Family Medicine

## 2019-07-24 ENCOUNTER — Other Ambulatory Visit: Payer: Self-pay

## 2019-07-24 ENCOUNTER — Encounter: Payer: Self-pay | Admitting: Family Medicine

## 2019-07-24 ENCOUNTER — Ambulatory Visit (INDEPENDENT_AMBULATORY_CARE_PROVIDER_SITE_OTHER): Payer: 59 | Admitting: Family Medicine

## 2019-07-24 VITALS — BP 148/94 | HR 108 | Temp 97.3°F | Resp 16 | Ht 62.5 in | Wt 178.8 lb

## 2019-07-24 DIAGNOSIS — R030 Elevated blood-pressure reading, without diagnosis of hypertension: Secondary | ICD-10-CM | POA: Diagnosis not present

## 2019-07-24 DIAGNOSIS — F5101 Primary insomnia: Secondary | ICD-10-CM | POA: Diagnosis not present

## 2019-07-24 DIAGNOSIS — R000 Tachycardia, unspecified: Secondary | ICD-10-CM | POA: Insufficient documentation

## 2019-07-24 DIAGNOSIS — E669 Obesity, unspecified: Secondary | ICD-10-CM

## 2019-07-24 DIAGNOSIS — E8881 Metabolic syndrome: Secondary | ICD-10-CM

## 2019-07-24 DIAGNOSIS — F4323 Adjustment disorder with mixed anxiety and depressed mood: Secondary | ICD-10-CM | POA: Diagnosis not present

## 2019-07-24 MED ORDER — TRAZODONE HCL 50 MG PO TABS: 100.0000 mg | ORAL_TABLET | Freq: Every day | ORAL | 3 refills | Status: DC

## 2019-07-24 MED ORDER — METFORMIN HCL ER 750 MG PO TB24: 750.0000 mg | ORAL_TABLET | Freq: Two times a day (BID) | ORAL | 3 refills | Status: DC

## 2019-07-24 NOTE — Progress Notes (Signed)
Subjective  CC:  Chief Complaint  Patient presents with   Transitions Of Care    HPI: Jeanne Parks is a 58 y.o. female who presents to the office today to address the problems listed above in the chief complaint. And TOC to establish with me today. I've reviewed chart in detail. Recent cpe and labs.   Blood pressure f/u: had borderline elevated bp at cpe visit in august. Here for recheck. No h/o htn. Obese but losing weight with help of metformin. Home bps around 140/70 but lower at times. Increased stressors with husband's health problems bening more acute again. No cp or h/o CAD.   BP Readings from Last 3 Encounters:  07/24/19 (!) 148/94  05/25/19 140/82  02/16/19 (!) 151/80   Wt Readings from Last 3 Encounters:  07/24/19 178 lb 12.8 oz (81.1 kg)  05/25/19 179 lb (81.2 kg)  02/16/19 177 lb (80.3 kg)    Insomnia: on trazadone with some improvement but still awakening at 3-4 am. In part due to worry. Has chronic anxiety.   Insulin resistance on metformin. High nl recent a1c  HM: due flu and crc screen.   Assessment  1. Elevated blood pressure reading without diagnosis of hypertension   2. Primary insomnia   3. Obesity (BMI 30-39.9)   4. Situational mixed anxiety and depressive disorder   5. Insulin resistance   6. Tachycardia      Plan    BP f/u: elevated readings; may be in part due to nervousness and white coat syndrome. Get more data with regular home bp checks and recheck in office in 6-8 weeks. To start BB if persists to help with tachycardia and anxiety as well.   Tachycardia : reports long history. No prior evaluation. No EKG available. Improved in office once she calmed down. rec EKG next visit. Nl cbc and tsh by lab review.   Insomnia: increase trazadone dose and education  Continue other meds as are.   Flu shot today Education regarding management of these chronic disease states was given. Management strategies discussed on successive visits include  dietary and exercise recommendations, goals of achieving and maintaining IBW, and lifestyle modifications aiming for adequate sleep and minimizing stressors.   Follow up: 6-8 weeks for bp recheck and sleep f/u  No orders of the defined types were placed in this encounter.  Meds ordered this encounter  Medications   traZODone (DESYREL) 50 MG tablet    Sig: Take 2 tablets (100 mg total) by mouth at bedtime.    Dispense:  180 tablet    Refill:  3   metFORMIN (GLUCOPHAGE-XR) 750 MG 24 hr tablet    Sig: Take 1 tablet (750 mg total) by mouth 2 (two) times daily.    Dispense:  180 tablet    Refill:  3      BP Readings from Last 3 Encounters:  07/24/19 (!) 148/94  05/25/19 140/82  02/16/19 (!) 151/80   Wt Readings from Last 3 Encounters:  07/24/19 178 lb 12.8 oz (81.1 kg)  05/25/19 179 lb (81.2 kg)  02/16/19 177 lb (80.3 kg)    Lab Results  Component Value Date   CHOL 204 (H) 05/25/2019   CHOL 196 11/06/2017   CHOL 191 10/31/2016   Lab Results  Component Value Date   HDL 52.50 05/25/2019   HDL 45 11/06/2017   HDL 48 (L) 10/31/2016   Lab Results  Component Value Date   LDLCALC 136 (H) 05/25/2019   LDLCALC 128 (  H) 11/06/2017   LDLCALC 122 (H) 10/31/2016   Lab Results  Component Value Date   TRIG 75.0 05/25/2019   TRIG 115 11/06/2017   TRIG 105 10/31/2016   Lab Results  Component Value Date   CHOLHDL 4 05/25/2019   CHOLHDL 4.4 11/06/2017   CHOLHDL 4.0 10/31/2016   No results found for: LDLDIRECT Lab Results  Component Value Date   CREATININE 1.02 05/25/2019   BUN 17 05/25/2019   NA 138 05/25/2019   K 4.3 05/25/2019   CL 102 05/25/2019   CO2 28 05/25/2019    The 10-year ASCVD risk score Mikey Bussing DC Jr., et al., 2013) is: 6.5%   Values used to calculate the score:     Age: 24 years     Sex: Female     Is Non-Hispanic African American: No     Diabetic: Yes     Tobacco smoker: No     Systolic Blood Pressure: 123456 mmHg     Is BP treated: No     HDL  Cholesterol: 52.5 mg/dL     Total Cholesterol: 204 mg/dL  I reviewed the patients updated PMH, FH, and SocHx.    Patient Active Problem List   Diagnosis Date Noted   Tachycardia 07/24/2019   Pure hypercholesterolemia 05/25/2019   Insulin resistance 05/25/2019   Situational mixed anxiety and depressive disorder 02/16/2019   Obesity (BMI 30-39.9) 08/20/2018   Primary insomnia 08/20/2018   OA (osteoarthritis) of right knee, s/p arthroscopy    Seasonal allergies     Allergies: Patient has no known allergies.  Social History: Patient  reports that she has never smoked. She has never used smokeless tobacco. She reports current alcohol use of about 2.0 - 3.0 standard drinks of alcohol per week. She reports that she does not use drugs.  Current Meds  Medication Sig   ALPRAZolam (XANAX) 0.5 MG tablet Take 1 tablet (0.5 mg total) by mouth 2 (two) times daily as needed for anxiety.   Biotin 10 MG TABS Take 1 tablet by mouth daily.   cetirizine (ZYRTEC) 10 MG chewable tablet Chew 10 mg by mouth daily.   cholecalciferol (VITAMIN D) 1000 units tablet Take 1,000 Units by mouth daily.   Cyanocobalamin (VITAMIN B 12 PO) Take by mouth.   hydrocortisone (ANUSOL-HC) 2.5 % rectal cream Apply topically BID prn   ibuprofen (ADVIL,MOTRIN) 800 MG tablet Take 800 mg by mouth daily.   lidocaine (XYLOCAINE) 5 % ointment Apply 1 application topically 3 (three) times daily. Prn   metFORMIN (GLUCOPHAGE-XR) 750 MG 24 hr tablet Take 1 tablet (750 mg total) by mouth 2 (two) times daily.   nystatin cream (MYCOSTATIN) APPLY TO AFFECTED AREA TWICE A DAY FOR UPTO 7 DAYS   traZODone (DESYREL) 50 MG tablet Take 2 tablets (100 mg total) by mouth at bedtime.   valACYclovir (VALTREX) 500 MG tablet 1 tab po BID x 3 days prn   [DISCONTINUED] metFORMIN (GLUCOPHAGE-XR) 750 MG 24 hr tablet TAKE 1 TABLET (750 MG TOTAL) BY MOUTH 2 (TWO) TIMES DAILY.   [DISCONTINUED] traZODone (DESYREL) 50 MG tablet TAKE 1/2  TO 1 TABLET (25-50 MG TOTAL) BY MOUTH AT BEDTIME AS NEEDED FOR SLEEP.    Review of Systems: Cardiovascular: negative for chest pain, palpitations, leg swelling, orthopnea Respiratory: negative for SOB, wheezing or persistent cough Gastrointestinal: negative for abdominal pain Genitourinary: negative for dysuria or gross hematuria  Objective  Vitals: BP (!) 148/94    Pulse (!) 108    Temp (!)  97.3 F (36.3 C) (Tympanic)    Resp 16    Ht 5' 2.5" (1.588 m)    Wt 178 lb 12.8 oz (81.1 kg)    LMP 01/06/2014 (Approximate)    SpO2 98%    BMI 32.18 kg/m  General: no acute distress but nervous Psych:  Alert and oriented, anxious mood and affect HEENT:  Normocephalic, atraumatic, supple neck  Cardiovascular:  RRR without murmur. no edema Respiratory:  Good breath sounds bilaterally, CTAB with normal respiratory effort Skin:  Warm, no rashes Neurologic:   Mental status is normal  Commons side effects, risks, benefits, and alternatives for medications and treatment plan prescribed today were discussed, and the patient expressed understanding of the given instructions. Patient is instructed to call or message via MyChart if he/she has any questions or concerns regarding our treatment plan. No barriers to understanding were identified. We discussed Red Flag symptoms and signs in detail. Patient expressed understanding regarding what to do in case of urgent or emergency type symptoms.   Medication list was reconciled, printed and provided to the patient in AVS. Patient instructions and summary information was reviewed with the patient as documented in the AVS. This note was prepared with assistance of Dragon voice recognition software. Occasional wrong-word or sound-a-like substitutions may have occurred due to the inherent limitations of voice recognition software

## 2019-07-24 NOTE — Patient Instructions (Signed)
Please return in 6-8 weeks to recheck your blood pressure and sleep.  Please increase your trazadone dose to 100mg  nightly.   Please check your blood pressures at home 2-3x/week at different times and log them for my review at your next visit.  We'd like your BP to be < 140/90 consistently.   It was a pleasure meeting you today! Thank you for choosing [place] to meet your healthcare needs! I truly look forward to working with you. If you have any questions or concerns, please send me a message via Mychart or call the office at [phone].

## 2019-09-07 ENCOUNTER — Ambulatory Visit: Payer: 59 | Admitting: Family Medicine

## 2019-09-25 ENCOUNTER — Other Ambulatory Visit: Payer: Self-pay

## 2019-09-28 ENCOUNTER — Encounter: Payer: Self-pay | Admitting: Family Medicine

## 2019-09-28 ENCOUNTER — Ambulatory Visit (INDEPENDENT_AMBULATORY_CARE_PROVIDER_SITE_OTHER): Payer: 59 | Admitting: Family Medicine

## 2019-09-28 VITALS — BP 156/88 | HR 131 | Temp 97.7°F | Ht 62.5 in | Wt 179.4 lb

## 2019-09-28 DIAGNOSIS — R000 Tachycardia, unspecified: Secondary | ICD-10-CM

## 2019-09-28 DIAGNOSIS — I10 Essential (primary) hypertension: Secondary | ICD-10-CM | POA: Insufficient documentation

## 2019-09-28 DIAGNOSIS — F4323 Adjustment disorder with mixed anxiety and depressed mood: Secondary | ICD-10-CM

## 2019-09-28 DIAGNOSIS — F5101 Primary insomnia: Secondary | ICD-10-CM

## 2019-09-28 DIAGNOSIS — E782 Mixed hyperlipidemia: Secondary | ICD-10-CM | POA: Insufficient documentation

## 2019-09-28 DIAGNOSIS — E8881 Metabolic syndrome: Secondary | ICD-10-CM

## 2019-09-28 MED ORDER — METOPROLOL SUCCINATE ER 25 MG PO TB24: 25.0000 mg | ORAL_TABLET | Freq: Every day | ORAL | 3 refills | Status: DC

## 2019-09-28 NOTE — Progress Notes (Signed)
Subjective  CC:  Chief Complaint  Patient presents with  . Hypertension  . Anxiety    HPI: Jeanne Parks is a 58 y.o. female who presents to the office today to address the problems listed above in the chief complaint.  Elevated bp f/u: she remains stressed due to husband's prolonged illness; was in hospital [facility] for 2 weeks prior to thanksgiving. Home bp readings have been normal with occasional elevated readings but checking inconsistently due to caring for husband.  no TIAs, no chest pain on exertion, no dyspnea on exertion, no swelling of ankles. H/o tachycardia but denies palpitations.    Stress and anxiety remain high but she is coping. Recently got a puppy and is enjoying that.  Sleep meds are helpful but she is awakened by hot flushes or by needs of husband often.    Assessment  1. Essential hypertension   2. Tachycardia   3. Situational mixed anxiety and depressive disorder   4. Primary insomnia   5. Insulin resistance   6. Mixed hyperlipidemia      Plan    Hypertension f/u: BP control is poorly controlled. Would start a low dose BB for bp control and to help with chronic tachycardia. EKG today shows sinus tach.  Hyperlipidemia f/u: would recommend statin. Pt is hesitant.   Insomnia: ok on trazadone. Will monitor hot flushes  Mood: stressed, situational. Defers medical therapy at this time .  IR on metformin.   Education regarding management of these chronic disease states was given. Management strategies discussed on successive visits include dietary and exercise recommendations, goals of achieving and maintaining IBW, and lifestyle modifications aiming for adequate sleep and minimizing stressors.   Follow up: Return in about 3 months (around 12/27/2019) for follow up Hypertension.  Orders Placed This Encounter  Procedures  . EKG   Meds ordered this encounter  Medications  . metoprolol succinate (TOPROL-XL) 25 MG 24 hr tablet    Sig: Take 1 tablet (25 mg  total) by mouth daily.    Dispense:  90 tablet    Refill:  3      BP Readings from Last 3 Encounters:  09/28/19 (!) 156/88  07/24/19 (!) 148/94  05/25/19 140/82   Wt Readings from Last 3 Encounters:  09/28/19 179 lb 6.4 oz (81.4 kg)  07/24/19 178 lb 12.8 oz (81.1 kg)  05/25/19 179 lb (81.2 kg)    Lab Results  Component Value Date   CHOL 204 (H) 05/25/2019   CHOL 196 11/06/2017   CHOL 191 10/31/2016   Lab Results  Component Value Date   HDL 52.50 05/25/2019   HDL 45 11/06/2017   HDL 48 (L) 10/31/2016   Lab Results  Component Value Date   LDLCALC 136 (H) 05/25/2019   LDLCALC 128 (H) 11/06/2017   LDLCALC 122 (H) 10/31/2016   Lab Results  Component Value Date   TRIG 75.0 05/25/2019   TRIG 115 11/06/2017   TRIG 105 10/31/2016   Lab Results  Component Value Date   CHOLHDL 4 05/25/2019   CHOLHDL 4.4 11/06/2017   CHOLHDL 4.0 10/31/2016   No results found for: LDLDIRECT Lab Results  Component Value Date   CREATININE 1.02 05/25/2019   BUN 17 05/25/2019   NA 138 05/25/2019   K 4.3 05/25/2019   CL 102 05/25/2019   CO2 28 05/25/2019    The 10-year ASCVD risk score (Goff DC Jr., et al., 2013) is: 9.7%   Values used to calculate the score:  Age: 76 years     Sex: Female     Is Non-Hispanic African American: No     Diabetic: Yes     Tobacco smoker: No     Systolic Blood Pressure: [id number] mmHg     Is BP treated: Yes     HDL Cholesterol: 52.5 mg/dL     Total Cholesterol: 204 mg/dL  I reviewed the patients updated PMH, FH, and SocHx.    Patient Active Problem List   Diagnosis Date Noted  . Mixed hyperlipidemia 09/28/2019  . Essential hypertension 09/28/2019  . Tachycardia 07/24/2019  . Pure hypercholesterolemia 05/25/2019  . Insulin resistance 05/25/2019  . Situational mixed anxiety and depressive disorder 02/16/2019  . Obesity (BMI 30-39.9) 08/20/2018  . Primary insomnia 08/20/2018  . OA (osteoarthritis) of right knee, s/p arthroscopy   . Seasonal  allergies     Allergies: Patient has no known allergies.  Social History: Patient  reports that she has never smoked. She has never used smokeless tobacco. She reports current alcohol use of about 2.0 - 3.0 standard drinks of alcohol per week. She reports that she does not use drugs.  Current Meds  Medication Sig  . ALPRAZolam (XANAX) 0.5 MG tablet Take 1 tablet (0.5 mg total) by mouth 2 (two) times daily as needed for anxiety.  . Biotin 10 MG TABS Take 1 tablet by mouth daily.  . cetirizine (ZYRTEC) 10 MG chewable tablet Chew 10 mg by mouth daily.  . cholecalciferol (VITAMIN D) 1000 units tablet Take 1,000 Units by mouth daily.  . Cyanocobalamin (VITAMIN B 12 PO) Take by mouth.  . hydrocortisone (ANUSOL-HC) 2.5 % rectal cream Apply topically BID prn  . ibuprofen (ADVIL,MOTRIN) 800 MG tablet Take 800 mg by mouth daily.  Marland Kitchen lidocaine (XYLOCAINE) 5 % ointment Apply 1 application topically 3 (three) times daily. Prn  . metFORMIN (GLUCOPHAGE-XR) 750 MG 24 hr tablet Take 1 tablet (750 mg total) by mouth 2 (two) times daily.  Marland Kitchen nystatin cream (MYCOSTATIN) APPLY TO AFFECTED AREA TWICE A DAY FOR UPTO 7 DAYS  . traZODone (DESYREL) 50 MG tablet Take 2 tablets (100 mg total) by mouth at bedtime.  . valACYclovir (VALTREX) 500 MG tablet 1 tab po BID x 3 days prn    Review of Systems: Cardiovascular: negative for chest pain, palpitations, leg swelling, orthopnea Respiratory: negative for SOB, wheezing or persistent cough Gastrointestinal: negative for abdominal pain Genitourinary: negative for dysuria or gross hematuria  Objective  Vitals: BP (!) 156/88 (BP Location: Right Arm, Patient Position: Sitting, Cuff Size: Normal)   Pulse (!) 131   Temp 97.7 F (36.5 C) (Temporal)   Ht 5' 2.5" (1.588 m)   Wt 179 lb 6.4 oz (81.4 kg)   LMP 01/06/2014 (Approximate)   SpO2 98%   BMI 32.29 kg/m  General: no acute distress  Psych:  Alert and oriented, normal mood and affect HEENT:  Normocephalic,  atraumatic, supple neck  Cardiovascular:  Tachy, regular rhythm without murmur. no edema Respiratory:  Good breath sounds bilaterally, CTAB with normal respiratory effort Skin:  Warm, no rashes Neurologic:   Mental status is normal, no tremor  EKG: sinus tach with nonspecific ST depression in lateral leads  Commons side effects, risks, benefits, and alternatives for medications and treatment plan prescribed today were discussed, and the patient expressed understanding of the given instructions. Patient is instructed to call or message via MyChart if he/she has any questions or concerns regarding our treatment plan. No barriers to understanding were identified.  We discussed Red Flag symptoms and signs in detail. Patient expressed understanding regarding what to do in case of urgent or emergency type symptoms.   Medication list was reconciled, printed and provided to the patient in AVS. Patient instructions and summary information was reviewed with the patient as documented in the AVS. This note was prepared with assistance of Dragon voice recognition software. Occasional wrong-word or sound-a-like substitutions may have occurred due to the inherent limitations of voice recognition software  This visit occurred during the SARS-CoV-2 public health emergency.  Safety protocols were in place, including screening questions prior to the visit, additional usage of staff PPE, and extensive cleaning of exam room while observing appropriate contact time as indicated for disinfecting solutions.

## 2019-09-28 NOTE — Patient Instructions (Signed)
Please return in 3 months for blood pressure recheck.   If you have any questions or concerns, please don't hesitate to send me a message via MyChart or call the office at [phone]. Thank you for visiting with [place] today! It's our pleasure caring for you.

## 2019-10-09 HISTORY — PX: OTHER SURGICAL HISTORY: SHX169

## 2019-12-23 ENCOUNTER — Other Ambulatory Visit: Payer: Self-pay

## 2019-12-23 ENCOUNTER — Ambulatory Visit (INDEPENDENT_AMBULATORY_CARE_PROVIDER_SITE_OTHER): Payer: 59 | Admitting: Obstetrics and Gynecology

## 2019-12-23 ENCOUNTER — Encounter: Payer: Self-pay | Admitting: Obstetrics and Gynecology

## 2019-12-23 VITALS — BP 122/72 | HR 114 | Temp 97.5°F | Ht 62.0 in | Wt 183.0 lb

## 2019-12-23 DIAGNOSIS — Z01419 Encounter for gynecological examination (general) (routine) without abnormal findings: Secondary | ICD-10-CM

## 2019-12-23 DIAGNOSIS — Z1211 Encounter for screening for malignant neoplasm of colon: Secondary | ICD-10-CM | POA: Diagnosis not present

## 2019-12-23 NOTE — Progress Notes (Signed)
59 y.o. G40P1011 Married White or Caucasian Not Hispanic or Latino female here for annual exam.  No bleeding. No dyspareunia.   Husband with kidney failure. Not eligible for another kidney transplant, will have to go on dialysis      Patient's last menstrual period was 01/06/2014 (approximate).          Sexually active: Yes.    The current method of family planning is post menopausal status.    Exercising: Yes.    walking Smoker:  no  Health Maintenance: Pap: 12/03/18 normal HR HPV Neg,  09/28/15 normal Neg HR HPV  History of abnormal Pap:  no MMG:  12/12/18 Density C Bi-ras 3 probably benign BMD:   Never  Colonoscopy: never  TDaP:  10/31/16  Gardasil: NA   reports that she has never smoked. She has never used smokeless tobacco. She reports current alcohol use of about 2.0 - 3.0 standard drinks of alcohol per week. She reports that she does not use drugs. Husband with h/o kidney transplant, DM. 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  Her daughter is married, she has 2 stepchildren   Past Medical History:  Diagnosis Date  . History of chicken pox   . HLD (hyperlipidemia)   . HSV-1 infection   . Insulin resistance   . OA (osteoarthritis) of right knee, s/p arthroscopy   . Seasonal allergies   . Skin cancer     Past Surgical History:  Procedure Laterality Date  . BREAST CYST EXCISION Left   . CESAREAN SECTION  08/27/86  . DILATION AND CURETTAGE OF UTERUS  1997   Miscarriage  . KNEE ARTHROSCOPY Right 02/2015  . TONSILLECTOMY  1967    Current Outpatient Medications  Medication Sig Dispense Refill  . ALPRAZolam (XANAX) 0.5 MG tablet Take 1 tablet (0.5 mg total) by mouth 2 (two) times daily as needed for anxiety. 30 tablet 0  . Biotin 10 MG TABS Take 1 tablet by mouth daily.    . cetirizine (ZYRTEC) 10 MG chewable tablet Chew 10 mg by mouth daily.    . cholecalciferol (VITAMIN D) 1000 units tablet Take 1,000 Units by mouth daily.    . Cyanocobalamin (VITAMIN B 12 PO) Take  by mouth.    . hydrocortisone (ANUSOL-HC) 2.5 % rectal cream Apply topically BID prn 30 g 1  . ibuprofen (ADVIL,MOTRIN) 800 MG tablet Take 800 mg by mouth daily.    Marland Kitchen lidocaine (XYLOCAINE) 5 % ointment Apply 1 application topically 3 (three) times daily. Prn 30 g 0  . metFORMIN (GLUCOPHAGE-XR) 750 MG 24 hr tablet Take 1 tablet (750 mg total) by mouth 2 (two) times daily. 180 tablet 3  . metoprolol succinate (TOPROL-XL) 25 MG 24 hr tablet Take 1 tablet (25 mg total) by mouth daily. 90 tablet 3  . nystatin cream (MYCOSTATIN) APPLY TO AFFECTED AREA TWICE A DAY FOR UPTO 7 DAYS 30 g 1  . traZODone (DESYREL) 50 MG tablet Take 2 tablets (100 mg total) by mouth at bedtime. 180 tablet 3  . valACYclovir (VALTREX) 500 MG tablet 1 tab po BID x 3 days prn 30 tablet 3   No current facility-administered medications for this visit.    Family History  Problem Relation Age of Onset  . Breast cancer Mother 70       lumpectomy and radiation; estrogen fed  . Hypertension Mother   . Heart disease Father   . Diabetes type I Sister 71  . Lung cancer Maternal Aunt   .  Cancer Paternal Uncle   . High Cholesterol Sister     Review of Systems  Constitutional: Negative.   HENT: Negative.   Eyes: Negative.   Respiratory: Negative.   Cardiovascular: Negative.   Gastrointestinal: Negative.   Endocrine: Negative.   Genitourinary: Negative.   Musculoskeletal: Negative.   Skin: Negative.   Allergic/Immunologic: Negative.   Neurological: Negative.   Hematological: Negative.   Psychiatric/Behavioral: Negative.     Exam:   LMP 01/06/2014 (Approximate)   Weight change: @WEIGHTCHANGE @ Height:      Ht Readings from Last 3 Encounters:  09/28/19 5' 2.5" (1.588 m)  07/24/19 5' 2.5" (1.588 m)  05/25/19 5' 2.6" (1.59 m)    General appearance: alert, cooperative and appears stated age Head: Normocephalic, without obvious abnormality, atraumatic Neck: no adenopathy, supple, symmetrical, trachea midline and  thyroid normal to inspection and palpation Lungs: clear to auscultation bilaterally Cardiovascular: regular rate and rhythm Breasts: normal appearance, no masses or tenderness Abdomen: soft, non-tender; non distended,  no masses,  no organomegaly Extremities: extremities normal, atraumatic, no cyanosis or edema Skin: Skin color, texture, turgor normal. No rashes or lesions Lymph nodes: Cervical, supraclavicular, and axillary nodes normal. No abnormal inguinal nodes palpated Neurologic: Grossly normal   Pelvic: External genitalia:  no lesions              Urethra:  normal appearing urethra with no masses, tenderness or lesions              Bartholins and Skenes: normal                 Vagina: normal appearing vagina with normal color and discharge, no lesions              Cervix: no lesions               Bimanual Exam:  Uterus:  normal size, contour, position, consistency, mobility, non-tender and retroverted              Adnexa: no mass, fullness, tenderness               Rectovaginal: Confirms               Anus:  normal sphincter tone, no lesions  Gae Dry chaperoned for the exam.  A:  Well Woman with normal exam  P:   No pap this year  Mammogram due  Will refer for Colonoscopy  Discussed breast self exam  Discussed calcium and vit D intake  Labs with his primary

## 2019-12-23 NOTE — Patient Instructions (Signed)

## 2019-12-30 ENCOUNTER — Ambulatory Visit (INDEPENDENT_AMBULATORY_CARE_PROVIDER_SITE_OTHER): Payer: 59 | Admitting: Family Medicine

## 2019-12-30 ENCOUNTER — Other Ambulatory Visit: Payer: Self-pay

## 2019-12-30 ENCOUNTER — Encounter: Payer: Self-pay | Admitting: Family Medicine

## 2019-12-30 VITALS — BP 128/80 | HR 98 | Temp 97.2°F | Ht 62.0 in | Wt 181.2 lb

## 2019-12-30 DIAGNOSIS — Z1211 Encounter for screening for malignant neoplasm of colon: Secondary | ICD-10-CM

## 2019-12-30 DIAGNOSIS — Z1212 Encounter for screening for malignant neoplasm of rectum: Secondary | ICD-10-CM

## 2019-12-30 DIAGNOSIS — I10 Essential (primary) hypertension: Secondary | ICD-10-CM

## 2019-12-30 DIAGNOSIS — R000 Tachycardia, unspecified: Secondary | ICD-10-CM

## 2019-12-30 DIAGNOSIS — Z1231 Encounter for screening mammogram for malignant neoplasm of breast: Secondary | ICD-10-CM

## 2019-12-30 NOTE — Patient Instructions (Addendum)
Please return in August 2021 for your annual complete physical; please come fasting.  I'm glad your blood pressure is now controlled.   If you have any questions or concerns, please don't hesitate to send me a message via MyChart or call the office at [phone]. Thank you for visiting with [place] today! It's our pleasure caring for you.  I recommend the Cologuard test for your colon cancer screening that is due. I have ordered this test for you. The [facility] will soon contact you to verify your insurance, address etc. They will then send you the kit; follow the instructions in the kit and return the kit to [facility]. They will run the test and send the results to me. I will then give you the results. If this test is negative, we recommend repeating a colon cancer screening test in 3 years. If it is positive, I will refer you to a Gastroenterologist so you can get set up for the recommended colonoscopy.  Thank you!  I have ordered a mammogram and/or bone density for you as we discussed today: '[x]'   Mammogram  '[]'   Bone Density  Please call the office checked below to schedule your appointment: Your appointment will at the following location  '[x]'   [facility]      [place], [place]         '[]'   [facility]  [place], [place]  Make sure to wear two peace clothing  No lotions powders or deodorants the day of the appointment Make sure to bring picture ID and insurance card.  Bring list of medications you are currently taking including any supplements.

## 2019-12-30 NOTE — Progress Notes (Signed)
Subjective  CC:  Chief Complaint  Patient presents with  . Hypertension    Averaging <140/80 at home. Denies HA, dizziness, or visual changes    HPI: Jeanne Parks is a 59 y.o. female who presents to the office today to address the problems listed above in the chief complaint.  Hypertension f/u: Control is good . Pt reports she is doing well. taking medications as instructed, no medication side effects noted, no TIAs, no chest pain on exertion, no dyspnea on exertion, no swelling of ankles. Now doing fine. Home bp readings normal: last night 128/72!  She denies adverse effects from his BP medications. Compliance with medication is good. No palpitations.  HM: had GYN PE last week with Dr Talbert Nan. Due mammo and CRC ca screen. Never has had colonoscopy and no FH of colon cancer.   Assessment  1. Essential hypertension   2. Sinus tachycardia   3. Encounter for screening mammogram for breast cancer   4. Screening for colorectal cancer      Plan    Hypertension f/u: BP control is well controlled. Continue low dose  Sinus tach improved on bb  Colon cancer screening options discussed; pt elects cologuard. Ordered.   Pt to set up appt for mammo at [facility]. Education regarding management of these chronic disease states was given. Management strategies discussed on successive visits include dietary and exercise recommendations, goals of achieving and maintaining IBW, and lifestyle modifications aiming for adequate sleep and minimizing stressors.   Follow up: Return in about 5 months (around 05/31/2020) for complete physical.  Orders Placed This Encounter  Procedures  . MM 3D SCREEN BREAST BILATERAL  . Cologuard   No orders of the defined types were placed in this encounter.     BP Readings from Last 3 Encounters:  12/30/19 128/80  12/23/19 122/72  09/28/19 (!) 156/88   Wt Readings from Last 3 Encounters:  12/30/19 181 lb 3.2 oz (82.2 kg)  12/23/19 183 lb (83 kg)  09/28/19 179  lb 6.4 oz (81.4 kg)    Lab Results  Component Value Date   CHOL 204 (H) 05/25/2019   CHOL 196 11/06/2017   CHOL 191 10/31/2016   Lab Results  Component Value Date   HDL 52.50 05/25/2019   HDL 45 11/06/2017   HDL 48 (L) 10/31/2016   Lab Results  Component Value Date   LDLCALC 136 (H) 05/25/2019   LDLCALC 128 (H) 11/06/2017   LDLCALC 122 (H) 10/31/2016   Lab Results  Component Value Date   TRIG 75.0 05/25/2019   TRIG 115 11/06/2017   TRIG 105 10/31/2016   Lab Results  Component Value Date   CHOLHDL 4 05/25/2019   CHOLHDL 4.4 11/06/2017   CHOLHDL 4.0 10/31/2016   No results found for: LDLDIRECT Lab Results  Component Value Date   CREATININE 1.02 05/25/2019   BUN 17 05/25/2019   NA 138 05/25/2019   K 4.3 05/25/2019   CL 102 05/25/2019   CO2 28 05/25/2019    The 10-year ASCVD risk score Mikey Bussing DC Jr., et al., 2013) is: 6.6%   Values used to calculate the score:     Age: 74 years     Sex: Female     Is Non-Hispanic African American: No     Diabetic: Yes     Tobacco smoker: No     Systolic Blood Pressure: 0000000 mmHg     Is BP treated: Yes     HDL Cholesterol: 52.5 mg/dL  Total Cholesterol: 204 mg/dL  I reviewed the patients updated PMH, FH, and SocHx.    Patient Active Problem List   Diagnosis Date Noted  . Mixed hyperlipidemia 09/28/2019  . Essential hypertension 09/28/2019  . Sinus tachycardia 07/24/2019  . Pure hypercholesterolemia 05/25/2019  . Insulin resistance 05/25/2019  . Situational mixed anxiety and depressive disorder 02/16/2019  . Obesity (BMI 30-39.9) 08/20/2018  . Primary insomnia 08/20/2018  . OA (osteoarthritis) of right knee, s/p arthroscopy   . Seasonal allergies     Allergies: Patient has no known allergies.  Social History: Patient  reports that she has never smoked. She has never used smokeless tobacco. She reports current alcohol use of about 2.0 - 3.0 standard drinks of alcohol per week. She reports that she does not use  drugs.  Current Meds  Medication Sig  . ALPRAZolam (XANAX) 0.5 MG tablet Take 1 tablet (0.5 mg total) by mouth 2 (two) times daily as needed for anxiety.  . Biotin 10 MG TABS Take 1 tablet by mouth daily.  . cetirizine (ZYRTEC) 10 MG chewable tablet Chew 10 mg by mouth daily.  . cholecalciferol (VITAMIN D) 1000 units tablet Take 1,000 Units by mouth daily.  . Cyanocobalamin (VITAMIN B 12 PO) Take by mouth.  . hydrocortisone (ANUSOL-HC) 2.5 % rectal cream Apply topically BID prn  . ibuprofen (ADVIL,MOTRIN) 800 MG tablet Take 800 mg by mouth daily.  Marland Kitchen lidocaine (XYLOCAINE) 5 % ointment Apply 1 application topically 3 (three) times daily. Prn  . metFORMIN (GLUCOPHAGE-XR) 750 MG 24 hr tablet Take 1 tablet (750 mg total) by mouth 2 (two) times daily.  . metoprolol succinate (TOPROL-XL) 25 MG 24 hr tablet Take 1 tablet (25 mg total) by mouth daily.  Marland Kitchen nystatin cream (MYCOSTATIN) APPLY TO AFFECTED AREA TWICE A DAY FOR UPTO 7 DAYS  . traZODone (DESYREL) 50 MG tablet Take 2 tablets (100 mg total) by mouth at bedtime.  . valACYclovir (VALTREX) 500 MG tablet 1 tab po BID x 3 days prn    Review of Systems: Cardiovascular: negative for chest pain, palpitations, leg swelling, orthopnea Respiratory: negative for SOB, wheezing or persistent cough Gastrointestinal: negative for abdominal pain Genitourinary: negative for dysuria or gross hematuria  Objective  Vitals: BP 128/80 (BP Location: Right Arm, Patient Position: Sitting, Cuff Size: Normal)   Pulse 98   Temp (!) 97.2 F (36.2 C) (Temporal)   Ht 5\' 2"  (1.575 m)   Wt 181 lb 3.2 oz (82.2 kg)   LMP 01/06/2014 (Approximate)   SpO2 96%   BMI 33.14 kg/m  General: no acute distress  Psych:  Alert and oriented, normal mood and affect Cardiovascular:  RRR without murmur. no edema Respiratory:  Good breath sounds bilaterally, CTAB with normal respiratory effort   Commons side effects, risks, benefits, and alternatives for medications and treatment  plan prescribed today were discussed, and the patient expressed understanding of the given instructions. Patient is instructed to call or message via MyChart if he/she has any questions or concerns regarding our treatment plan. No barriers to understanding were identified. We discussed Red Flag symptoms and signs in detail. Patient expressed understanding regarding what to do in case of urgent or emergency type symptoms.   Medication list was reconciled, printed and provided to the patient in AVS. Patient instructions and summary information was reviewed with the patient as documented in the AVS. This note was prepared with assistance of Dragon voice recognition software. Occasional wrong-word or sound-a-like substitutions may have occurred due to  the inherent limitations of voice recognition software  This visit occurred during the SARS-CoV-2 public health emergency.  Safety protocols were in place, including screening questions prior to the visit, additional usage of staff PPE, and extensive cleaning of exam room while observing appropriate contact time as indicated for disinfecting solutions.

## 2020-01-14 ENCOUNTER — Other Ambulatory Visit: Payer: Self-pay | Admitting: Family Medicine

## 2020-01-14 DIAGNOSIS — N63 Unspecified lump in unspecified breast: Secondary | ICD-10-CM

## 2020-01-23 ENCOUNTER — Ambulatory Visit: Payer: 59 | Attending: Internal Medicine

## 2020-01-23 DIAGNOSIS — Z23 Encounter for immunization: Secondary | ICD-10-CM

## 2020-01-23 NOTE — Progress Notes (Signed)
   Covid-19 Vaccination Clinic  Name:  Jeanne Parks    MRN: [id number] DOB: 31-Jul-1961  01/23/2020  [name] was observed post Covid-19 immunization for 15 minutes without incident. She was provided with Vaccine Information Sheet and instruction to access the V-Safe system.   [name] was instructed to call 911 with any severe reactions post vaccine: Marland Kitchen Difficulty breathing  . Swelling of face and throat  . A fast heartbeat  . A bad rash all over body  . Dizziness and weakness   Immunizations Administered    Name Date Dose VIS Date Route   Pfizer COVID-19 Vaccine 01/23/2020  9:57 AM 0.3 mL 09/18/2019 Intramuscular   Manufacturer: [place]   Lot: [id number]   [place]: [id number]

## 2020-01-27 ENCOUNTER — Ambulatory Visit
Admission: RE | Admit: 2020-01-27 | Discharge: 2020-01-27 | Disposition: A | Discharge status: Home / Self Care | Payer: 59 | Source: Ambulatory Visit | Attending: Family Medicine | Admitting: Family Medicine

## 2020-01-27 ENCOUNTER — Other Ambulatory Visit: Payer: Self-pay

## 2020-01-27 DIAGNOSIS — N63 Unspecified lump in unspecified breast: Secondary | ICD-10-CM

## 2020-02-04 ENCOUNTER — Encounter: Payer: Self-pay | Admitting: Obstetrics and Gynecology

## 2020-02-15 ENCOUNTER — Ambulatory Visit: Payer: 59 | Attending: Internal Medicine

## 2020-02-15 DIAGNOSIS — Z23 Encounter for immunization: Secondary | ICD-10-CM

## 2020-02-15 NOTE — Progress Notes (Signed)
   Covid-19 Vaccination Clinic  Name:  BRITTINI HAYMER    MRN: [id number] DOB: 02-26-1961  02/15/2020  Ms. Starkman was observed post Covid-19 immunization for 15 minutes without incident. She was provided with Vaccine Information Sheet and instruction to access the V-Safe system.   Ms. Leppanen was instructed to call 911 with any severe reactions post vaccine: Marland Kitchen Difficulty breathing  . Swelling of face and throat  . A fast heartbeat  . A bad rash all over body  . Dizziness and weakness   Immunizations Administered    Name Date Dose VIS Date Route   Pfizer COVID-19 Vaccine 02/15/2020  4:10 PM 0.3 mL 12/02/2018 Intramuscular   Manufacturer: [place]   Lot: [id number]   [place]: [id number]

## 2020-05-24 ENCOUNTER — Telehealth: Payer: Self-pay | Admitting: Family Medicine

## 2020-05-24 NOTE — Telephone Encounter (Signed)
Phone automatically went to voicemail. LMOVM to call back. Also sent MyChart message with recommendations per Dr. Jonni Sanger

## 2020-05-24 NOTE — Telephone Encounter (Signed)
Patient made an appt for tomorrow for sciatic pain and wanted to know if there was any over the counter medication she could take until her appt.

## 2020-05-24 NOTE — Telephone Encounter (Signed)
She can take ibuprofen 600mg  tid with food, naprosyn 2 twice a day or ES tylenol 2 tabs every 4-6 hours

## 2020-05-24 NOTE — Telephone Encounter (Signed)
Please advise 

## 2020-05-25 ENCOUNTER — Ambulatory Visit: Payer: 59 | Admitting: Family Medicine

## 2020-05-25 ENCOUNTER — Ambulatory Visit (INDEPENDENT_AMBULATORY_CARE_PROVIDER_SITE_OTHER): Payer: 59 | Admitting: Family Medicine

## 2020-05-25 ENCOUNTER — Other Ambulatory Visit: Payer: Self-pay

## 2020-05-25 ENCOUNTER — Encounter: Payer: Self-pay | Admitting: Family Medicine

## 2020-05-25 VITALS — BP 150/90 | HR 89

## 2020-05-25 DIAGNOSIS — M5416 Radiculopathy, lumbar region: Secondary | ICD-10-CM | POA: Diagnosis not present

## 2020-05-25 MED ORDER — TRAMADOL HCL 50 MG PO TABS: 50.0000 mg | ORAL_TABLET | Freq: Four times a day (QID) | ORAL | 0 refills | Status: DC | PRN

## 2020-05-25 MED ORDER — PREDNISONE 10 MG PO TABS: ORAL_TABLET | ORAL | 0 refills | Status: DC

## 2020-05-25 MED ORDER — CYCLOBENZAPRINE HCL 10 MG PO TABS: 10.0000 mg | ORAL_TABLET | Freq: Every evening | ORAL | 0 refills | Status: DC | PRN

## 2020-05-25 MED ORDER — GABAPENTIN 100 MG PO CAPS: ORAL_CAPSULE | ORAL | 0 refills | Status: DC

## 2020-05-25 MED ORDER — KETOROLAC TROMETHAMINE 60 MG/2ML IM SOLN
60.0000 mg | Freq: Once | INTRAMUSCULAR | Status: AC
  Administered 2020-05-25: 60 mg via INTRAMUSCULAR

## 2020-05-25 NOTE — Progress Notes (Signed)
Subjective  CC:  Chief Complaint  Patient presents with  . Sciatica    shooting pain right hip to ankle, pain has worsened over the past several weeks, unable to stand up straight, needing assistance with wheelchair    HPI: Jeanne Parks is a 59 y.o. female who presents to the office today to address the problems listed above in the chief complaint.  59 year old female with no history of back problems with several week history of right lower back pain progressive over the last 2 weeks to sharp moderately severe shooting pain down to the right ankle associated with back and muscle spasms.  Uncomfortable in most positions although worse with trying to stand straight/erect.  She denies fall, strain or injury.  No bowel or bladder dysfunction.  No numbness or weakness.  Shooting pain has been severe and has limited her ability to walk this morning.  She is feeling a little bit more comfortable after ibuprofen today.  She sitting in the wheelchair and is comfortable in certain positions will have sharp pains with other positions.  Assessment  1. Acute right lumbar radiculopathy      Plan   Acute right lumbar radiculopathy: Presumed, treated with prednisone, tramadol as needed, gabapentin and Flexeril.  Educated on use expectations of each medication.  Discussed side effects.  No red flag symptoms now.  Patient to let me know if symptoms worsen or she develops emergency symptoms.  She has follow-up scheduled with me next week for recheck.  Patient was given a Toradol injection while in the office.  Mild improvement in symptoms.  No adverse effects.  Follow up: For recheck and complete physical 06/02/2020  No orders of the defined types were placed in this encounter.  Meds ordered this encounter  Medications  . predniSONE (DELTASONE) 10 MG tablet    Sig: Take 4 tabs qd x 2 days, 3 qd x 2 days, 2 qd x 2d, 1qd x 3 days    Dispense:  21 tablet    Refill:  0  . gabapentin (NEURONTIN) 100 MG  capsule    Sig: Take 1 capsule (100 mg total) by mouth in the morning AND 3 capsules (300 mg total) at bedtime.    Dispense:  120 capsule    Refill:  0  . traMADol (ULTRAM) 50 MG tablet    Sig: Take 1 tablet (50 mg total) by mouth every 6 (six) hours as needed for moderate pain.    Dispense:  30 tablet    Refill:  0  . cyclobenzaprine (FLEXERIL) 10 MG tablet    Sig: Take 1 tablet (10 mg total) by mouth at bedtime as needed for muscle spasms.    Dispense:  30 tablet    Refill:  0  . ketorolac (TORADOL) injection 60 mg      I reviewed the patients updated PMH, FH, and SocHx.    Patient Active Problem List   Diagnosis Date Noted  . Mixed hyperlipidemia 09/28/2019  . Essential hypertension 09/28/2019  . Sinus tachycardia 07/24/2019  . Pure hypercholesterolemia 05/25/2019  . Insulin resistance 05/25/2019  . Situational mixed anxiety and depressive disorder 02/16/2019  . Obesity (BMI 30-39.9) 08/20/2018  . Primary insomnia 08/20/2018  . OA (osteoarthritis) of right knee, s/p arthroscopy   . Seasonal allergies    Current Meds  Medication Sig  . ALPRAZolam (XANAX) 0.5 MG tablet Take 1 tablet (0.5 mg total) by mouth 2 (two) times daily as needed for anxiety.  . Biotin  10 MG TABS Take 1 tablet by mouth daily.  . cetirizine (ZYRTEC) 10 MG chewable tablet Chew 10 mg by mouth daily.  . cholecalciferol (VITAMIN D) 1000 units tablet Take 1,000 Units by mouth daily.  . Cyanocobalamin (VITAMIN B 12 PO) Take by mouth.  . hydrocortisone (ANUSOL-HC) 2.5 % rectal cream Apply topically BID prn  . ibuprofen (ADVIL,MOTRIN) 800 MG tablet Take 800 mg by mouth daily.  Marland Kitchen lidocaine (XYLOCAINE) 5 % ointment Apply 1 application topically 3 (three) times daily. Prn  . metFORMIN (GLUCOPHAGE-XR) 750 MG 24 hr tablet Take 1 tablet (750 mg total) by mouth 2 (two) times daily.  . metoprolol succinate (TOPROL-XL) 25 MG 24 hr tablet Take 1 tablet (25 mg total) by mouth daily.  Marland Kitchen nystatin cream (MYCOSTATIN) APPLY  TO AFFECTED AREA TWICE A DAY FOR UPTO 7 DAYS  . traZODone (DESYREL) 50 MG tablet Take 2 tablets (100 mg total) by mouth at bedtime.  . valACYclovir (VALTREX) 500 MG tablet 1 tab po BID x 3 days prn    Allergies: Patient has No Known Allergies. Family History: Patient family history includes Breast cancer (age of onset: 34) in her mother; Cancer in her paternal uncle; Diabetes type I (age of onset: 17) in her sister; Heart disease in her father; High Cholesterol in her sister; Hypertension in her mother; Lung cancer in her maternal aunt. Social History:  Patient  reports that she has never smoked. She has never used smokeless tobacco. She reports current alcohol use of about 2.0 - 3.0 standard drinks of alcohol per week. She reports that she does not use drugs.  Review of Systems: Constitutional: Negative for fever malaise or anorexia Cardiovascular: negative for chest pain Respiratory: negative for SOB or persistent cough Gastrointestinal: negative for abdominal pain  Objective  Vitals: BP (!) 150/90   Pulse 89   LMP 01/06/2014 (Approximate)   SpO2 98%  General: no acute distress but uncomfortable in certain positions, A&Ox3 HEENT: PEERL, conjunctiva normal, neck is supple Cardiovascular:  RRR without murmur or gallop.  Respiratory:  Good breath sounds bilaterally, CTAB with normal respiratory effort Skin:  Warm, no rashes Back: Right paravertebral muscle spasms present, sciatic notch on the right is tender.  Negative straight leg raise bilaterally, +2 DTRs in knees and ankles bilaterally.  Normal bilateral lower extremity.  Antalgic gait     Commons side effects, risks, benefits, and alternatives for medications and treatment plan prescribed today were discussed, and the patient expressed understanding of the given instructions. Patient is instructed to call or message via MyChart if he/she has any questions or concerns regarding our treatment plan. No barriers to understanding were  identified. We discussed Red Flag symptoms and signs in detail. Patient expressed understanding regarding what to do in case of urgent or emergency type symptoms.   Medication list was reconciled, printed and provided to the patient in AVS. Patient instructions and summary information was reviewed with the patient as documented in the AVS. This note was prepared with assistance of Dragon voice recognition software. Occasional wrong-word or sound-a-like substitutions may have occurred due to the inherent limitations of voice recognition software  This visit occurred during the SARS-CoV-2 public health emergency.  Safety protocols were in place, including screening questions prior to the visit, additional usage of staff PPE, and extensive cleaning of exam room while observing appropriate contact time as indicated for disinfecting solutions.

## 2020-05-25 NOTE — Patient Instructions (Signed)
Please follow up as scheduled for your next visit with me: 06/02/2020   If you have any questions or concerns, please don't hesitate to send me a message via MyChart or call the office at [phone]. Thank you for visiting with Jeanne Parks today! It's our pleasure caring for you.   Sciatica  Sciatica is pain, numbness, weakness, or tingling along the path of the sciatic nerve. The sciatic nerve starts in the lower back and runs down the back of each leg. The nerve controls the muscles in the lower leg and in the back of the knee. It also provides feeling (sensation) to the back of the thigh, the lower leg, and the sole of the foot. Sciatica is a symptom of another medical condition that pinches or puts pressure on the sciatic nerve. Sciatica most often only affects one side of the body. Sciatica usually goes away on its own or with treatment. In some cases, sciatica may come back (recur). What are the causes? This condition is caused by pressure on the sciatic nerve or pinching of the nerve. This may be the result of:  A disk in between the bones of the spine bulging out too far (herniated disk).  Age-related changes in the spinal disks.  A pain disorder that affects a muscle in the buttock.  Extra bone growth near the sciatic nerve.  A break (fracture) of the pelvis.  Pregnancy.  Tumor. This is rare. What increases the risk? The following factors may make you more likely to develop this condition:  Playing sports that place pressure or stress on the spine.  Having poor strength and flexibility.  A history of back injury or surgery.  Sitting for long periods of time.  Doing activities that involve repetitive bending or lifting.  Obesity. What are the signs or symptoms? Symptoms can vary from mild to very severe, and they may include:  Any of these problems in the lower back, leg, hip, or buttock: ? Mild tingling, numbness, or dull aches. ? Burning sensations. ? Sharp  pains.  Numbness in the back of the calf or the sole of the foot.  Leg weakness.  Severe back pain that makes movement difficult. Symptoms may get worse when you cough, sneeze, or laugh, or when you sit or stand for long periods of time. How is this diagnosed? This condition may be diagnosed based on:  Your symptoms and medical history.  A physical exam.  Blood tests.  Imaging tests, such as: ? X-rays. ? MRI. ? CT scan. How is this treated? In many cases, this condition improves on its own without treatment. However, treatment may include:  Reducing or modifying physical activity.  Exercising and stretching.  Icing and applying heat to the affected area.  Medicines that help to: ? Relieve pain and swelling. ? Relax your muscles.  Injections of medicines that help to relieve pain, irritation, and inflammation around the sciatic nerve (steroids).  Surgery. Follow these instructions at home: Medicines  Take over-the-counter and prescription medicines only as told by your health care provider.  Ask your health care provider if the medicine prescribed to you: ? Requires you to avoid driving or using heavy machinery. ? Can cause constipation. You may need to take these actions to prevent or treat constipation:  Drink enough fluid to keep your urine pale yellow.  Take over-the-counter or prescription medicines.  Eat foods that are high in fiber, such as beans, whole grains, and fresh fruits and vegetables.  Limit foods that are  high in fat and processed sugars, such as fried or sweet foods. Managing pain      If directed, put ice on the affected area. ? Put ice in a plastic bag. ? Place a towel between your skin and the bag. ? Leave the ice on for 20 minutes, 2-3 times a day.  If directed, apply heat to the affected area. Use the heat source that your health care provider recommends, such as a moist heat pack or a heating pad. ? Place a towel between your skin  and the heat source. ? Leave the heat on for 20-30 minutes. ? Remove the heat if your skin turns bright red. This is especially important if you are unable to feel pain, heat, or cold. You may have a greater risk of getting burned. Activity   Return to your normal activities as told by your health care provider. Ask your health care provider what activities are safe for you.  Avoid activities that make your symptoms worse.  Take brief periods of rest throughout the day. ? When you rest for longer periods, mix in some mild activity or stretching between periods of rest. This will help to prevent stiffness and pain. ? Avoid sitting for long periods of time without moving. Get up and move around at least one time each hour.  Exercise and stretch regularly, as told by your health care provider.  Do not lift anything that is heavier than 10 lb (4.5 kg) while you have symptoms of sciatica. When you do not have symptoms, you should still avoid heavy lifting, especially repetitive heavy lifting.  When you lift objects, always use proper lifting technique, which includes: ? Bending your knees. ? Keeping the load close to your body. ? Avoiding twisting. General instructions  Maintain a healthy weight. Excess weight puts extra stress on your back.  Wear supportive, comfortable shoes. Avoid wearing high heels.  Avoid sleeping on a mattress that is too soft or too hard. A mattress that is firm enough to support your back when you sleep may help to reduce your pain.  Keep all follow-up visits as told by your health care provider. This is important. Contact a health care provider if:  You have pain that: ? Wakes you up when you are sleeping. ? Gets worse when you lie down. ? Is worse than you have experienced in the past. ? Lasts longer than 4 weeks.  You have an unexplained weight loss. Get help right away if:  You are not able to control when you urinate or have bowel movements  (incontinence).  You have: ? Weakness in your lower back, pelvis, buttocks, or legs that gets worse. ? Redness or swelling of your back. ? A burning sensation when you urinate. Summary  Sciatica is pain, numbness, weakness, or tingling along the path of the sciatic nerve.  This condition is caused by pressure on the sciatic nerve or pinching of the nerve.  Sciatica can cause pain, numbness, or tingling in the lower back, legs, hips, and buttocks.  Treatment often includes rest, exercise, medicines, and applying ice or heat. This information is not intended to replace advice given to you by your health care provider. Make sure you discuss any questions you have with your health care provider. Document Revised: 10/13/2018 Document Reviewed: 10/13/2018 Elsevier Patient Education  [place].

## 2020-06-02 ENCOUNTER — Ambulatory Visit (INDEPENDENT_AMBULATORY_CARE_PROVIDER_SITE_OTHER): Payer: 59 | Admitting: Family Medicine

## 2020-06-02 ENCOUNTER — Other Ambulatory Visit: Payer: Self-pay

## 2020-06-02 ENCOUNTER — Encounter: Payer: Self-pay | Admitting: Family Medicine

## 2020-06-02 VITALS — BP 136/86 | HR 89 | Temp 97.9°F | Resp 15 | Ht 62.0 in | Wt 185.4 lb

## 2020-06-02 DIAGNOSIS — Z0000 Encounter for general adult medical examination without abnormal findings: Secondary | ICD-10-CM | POA: Diagnosis not present

## 2020-06-02 DIAGNOSIS — I10 Essential (primary) hypertension: Secondary | ICD-10-CM

## 2020-06-02 DIAGNOSIS — Z1212 Encounter for screening for malignant neoplasm of rectum: Secondary | ICD-10-CM

## 2020-06-02 DIAGNOSIS — M5416 Radiculopathy, lumbar region: Secondary | ICD-10-CM

## 2020-06-02 DIAGNOSIS — Z1211 Encounter for screening for malignant neoplasm of colon: Secondary | ICD-10-CM | POA: Diagnosis not present

## 2020-06-02 DIAGNOSIS — R000 Tachycardia, unspecified: Secondary | ICD-10-CM

## 2020-06-02 DIAGNOSIS — E782 Mixed hyperlipidemia: Secondary | ICD-10-CM | POA: Diagnosis not present

## 2020-06-02 DIAGNOSIS — E8881 Metabolic syndrome: Secondary | ICD-10-CM

## 2020-06-02 DIAGNOSIS — F5101 Primary insomnia: Secondary | ICD-10-CM

## 2020-06-02 DIAGNOSIS — F4323 Adjustment disorder with mixed anxiety and depressed mood: Secondary | ICD-10-CM

## 2020-06-02 LAB — CBC WITH DIFFERENTIAL/PLATELET
Absolute Monocytes: 972 cells/uL — ABNORMAL HIGH (ref 200–950)
Basophils Absolute: 57 cells/uL (ref 0–200)
Basophils Relative: 0.5 %
Eosinophils Absolute: 136 cells/uL (ref 15–500)
Eosinophils Relative: 1.2 %
HCT: 37.4 % (ref 35.0–45.0)
Hemoglobin: 12.1 g/dL (ref 11.7–15.5)
Lymphs Abs: 2938 cells/uL (ref 850–3900)
MCH: 29.3 pg (ref 27.0–33.0)
MCHC: 32.4 g/dL (ref 32.0–36.0)
MCV: 90.6 fL (ref 80.0–100.0)
MPV: 10 fL (ref 7.5–12.5)
Monocytes Relative: 8.6 %
Neutro Abs: 7198 cells/uL (ref 1500–7800)
Neutrophils Relative %: 63.7 %
Platelets: 318 10*3/uL (ref 140–400)
RBC: 4.13 10*6/uL (ref 3.80–5.10)
RDW: 12.6 % (ref 11.0–15.0)
Total Lymphocyte: 26 %
WBC: 11.3 10*3/uL — ABNORMAL HIGH (ref 3.8–10.8)

## 2020-06-02 LAB — COMPREHENSIVE METABOLIC PANEL
AG Ratio: 1.8 (calc) (ref 1.0–2.5)
ALT: 19 U/L (ref 6–29)
AST: 14 U/L (ref 10–35)
Albumin: 4 g/dL (ref 3.6–5.1)
Alkaline phosphatase (APISO): 81 U/L (ref 37–153)
BUN: 21 mg/dL (ref 7–25)
CO2: 33 mmol/L — ABNORMAL HIGH (ref 20–32)
Calcium: 9.7 mg/dL (ref 8.6–10.4)
Chloride: 98 mmol/L (ref 98–110)
Creat: 1.04 mg/dL (ref 0.50–1.05)
Globulin: 2.2 g/dL (calc) (ref 1.9–3.7)
Glucose, Bld: 83 mg/dL (ref 65–99)
Potassium: 4 mmol/L (ref 3.5–5.3)
Sodium: 137 mmol/L (ref 135–146)
Total Bilirubin: 0.4 mg/dL (ref 0.2–1.2)
Total Protein: 6.2 g/dL (ref 6.1–8.1)

## 2020-06-02 LAB — POCT GLYCOSYLATED HEMOGLOBIN (HGB A1C): Hemoglobin A1C: 5.5 % (ref 4.0–5.6)

## 2020-06-02 LAB — LIPID PANEL
Cholesterol: 200 mg/dL — ABNORMAL HIGH (ref <200)
HDL: 60 mg/dL (ref >=50)
LDL Cholesterol (Calc): 124 mg/dL (calc) — ABNORMAL HIGH
Non-HDL Cholesterol (Calc): 140 mg/dL (calc) — ABNORMAL HIGH (ref <130)
Total CHOL/HDL Ratio: 3.3 (calc) (ref <5.0)
Triglycerides: 70 mg/dL (ref <150)

## 2020-06-02 MED ORDER — TRAMADOL HCL 50 MG PO TABS
50.0000 mg | ORAL_TABLET | Freq: Four times a day (QID) | ORAL | 0 refills | Status: DC | PRN
Start: 2020-06-02 — End: 2020-06-14

## 2020-06-02 NOTE — Patient Instructions (Addendum)
Please return in 6 months for recheck blood pressure, sugars and sleep  I will release your lab results to you on your MyChart account with further instructions. Please reply with any questions.    If you have any questions or concerns, please don't hesitate to send me a message via MyChart or call the office at [phone]. Thank you for visiting with [place] today! It's our pleasure caring for you.  Please go to our [facility] office to get your xrays done. You can walk in M-F between 8:30am- noon or 1pm - 5pm. Tell them you are there for xrays ordered by me. They will send me the results, then I will let you know the results with instructions.   Address: [address].  The Xray department is located in the basement.   I recommend the Cologuard test for your colon cancer screening that is due. I have ordered this test for you. [facility] will soon contact you to verify your insurance, address etc. They will then send you the kit; follow the instructions in the kit and return the kit to [facility]. They will run the test and send the results to me. I will then give you the results. If this test is negative, we recommend repeating a colon cancer screening test in 3 years. If it is positive, I will refer you to a Gastroenterologist so you can get set up for the recommended colonoscopy.  Thank you!

## 2020-06-02 NOTE — Progress Notes (Signed)
Subjective  Chief Complaint  Patient presents with  . Annual Exam    fasting  . Sciatica    follow up from last week, has improved but still experiencing pain    HPI: Jeanne Parks is a 59 y.o. female who presents to [facility] today for a Female Wellness Visit. She also has the concerns and/or needs as listed above in the chief complaint. These will be addressed in addition to the Health Maintenance Visit.   Wellness Visit: annual visit with health maintenance review and exam without Pap   Health maintenance: Due for colon cancer screening.  Never received her Cologuard.  Will order again.  Mammogram up-to-date.  Defers breast exam today, will have GYN physical in January.  Immunizations are up-to-date.  Diet is fair, avoids vegetables.  Describes diet as "meat and potatoes".  Weight is stable. Chronic disease f/u and/or acute problem visit: (deemed necessary to be done in addition to the wellness visit):  History of prediabetes/insulin resistance with highest A1c noted at 5.9.  On high-dose metformin twice daily.  Hypertension and sinus tachycardia well controlled on beta-blocker.  No palpitations.  No chest pain shortness of breath.  Tolerating medications well.  History of hyperlipidemia on statin.  Well-tolerated on  Anxiety due to stress: Much improved.  Husband's health has improved.  Rare Xanax use  Insomnia improved as well, on chronic trazodone  Follow-up right lumbar radiculopathy: Improving on tramadol, prednisone taper, gabapentin.  Now able to walk but still with a limp due to pain.  Sleeping better.  Feels some numbness in her right great toe.  Also with persistent right lateral calf pain.  No bowel or bladder incontinence.  No weakness.  No history of back problems see last note  Assessment  1. Annual physical exam   2. Insulin resistance   3. Essential hypertension   4. Mixed hyperlipidemia   5. Sinus tachycardia   6. Situational  mixed anxiety and depressive disorder   7. Primary insomnia   8. Screening for colorectal cancer   9. Acute right lumbar radiculopathy      Plan  Female Wellness Visit:  Age appropriate Health Maintenance and Prevention measures were discussed with patient. Included topics are cancer screening recommendations, ways to keep healthy (see AVS) including dietary and exercise recommendations, regular eye and dental care, use of seat belts, and avoidance of moderate alcohol use and tobacco use.  Cologuard reordered.  BMI: discussed patient's BMI and encouraged positive lifestyle modifications to help get to or maintain a target BMI.  HM needs and immunizations were addressed and ordered. See below for orders. See HM and immunization section for updates.  Routine labs and screening tests ordered including cmp, cbc and lipids where appropriate.  Discussed recommendations regarding Vit D and calcium supplementation (see AVS)  Chronic disease management visit and/or acute problem visit:  Insulin resistance: Educated.  We'll stop Metformin and monitor.  Discussed diet improvements.  Hypertension and tachycardia are well controlled on beta-blocker.  Check renal electrolytes.  Hyperlipidemia on statin due for recheck.  She is fasting.  Check LFTs.  Anxiety and insomnia are well controlled.  No changes  Right lumbar radiculopathy, suspect S1 nerve root impingement.  Start physical therapy and continue gabapentin and tramadol.  May require Ortho referral if not improving over time.  Conservative therapy discussed.  Check x-rays  Follow up: 6 months for blood pressure, sugar recheck Orders Placed This Encounter  Procedures  . DG  Lumbar Spine Complete  . CBC with Differential/Platelet  . Comprehensive metabolic panel  . Lipid panel  . Cologuard  . POCT HgB A1C   Meds ordered this encounter  Medications  . traMADol (ULTRAM) 50 MG tablet    Sig: Take 1 tablet (50 mg total) by mouth every 6  (six) hours as needed for moderate pain.    Dispense:  30 tablet    Refill:  0      Lifestyle: Body mass index is 33.91 kg/m. Wt Readings from Last 3 Encounters:  06/02/20 185 lb 6.4 oz (84.1 kg)  12/30/19 181 lb 3.2 oz (82.2 kg)  12/23/19 183 lb (83 kg)     Patient Active Problem List   Diagnosis Date Noted  . Mixed hyperlipidemia 09/28/2019  . Essential hypertension 09/28/2019  . Sinus tachycardia 07/24/2019  . Insulin resistance 05/25/2019  . Situational mixed anxiety and depressive disorder 02/16/2019  . Obesity (BMI 30-39.9) 08/20/2018  . Primary insomnia 08/20/2018  . OA (osteoarthritis) of right knee, s/p arthroscopy   . Seasonal allergies    Health Maintenance  Topic Date Due  . Fecal DNA (Cologuard)  Never done  . MAMMOGRAM  01/26/2021  . PAP SMEAR-Modifier  12/04/2023  . TETANUS/TDAP  10/31/2026  . COVID-19 Vaccine  Completed  . INFLUENZA VACCINE  Discontinued  . URINE MICROALBUMIN  Discontinued   Immunization History  Administered Date(s) Administered  . Influenza,inj,Quad PF,6+ Mos 08/20/2018  . PFIZER SARS-COV-2 Vaccination 01/23/2020, 02/15/2020  . Pneumococcal Polysaccharide-23 05/25/2019  . Tdap 10/31/2016   We updated and reviewed the patient's past history in detail and it is documented below. Allergies: Patient has No Known Allergies. Past Medical History Patient  has a past medical history of History of chicken pox, HLD (hyperlipidemia), HSV-1 infection, Insulin resistance, OA (osteoarthritis) of right knee, s/p arthroscopy, Seasonal allergies, and Skin cancer. Past Surgical History Patient  has a past surgical history that includes Cesarean section (08/27/86); Dilation and curettage of uterus (1997); Knee arthroscopy (Right, 02/2015); Tonsillectomy (1967); and Breast cyst excision (Left, 10+ yrs ago). Family History: Patient family history includes Breast cancer (age of onset: 54) in her mother; Cancer in her paternal uncle; Diabetes type I  (age of onset: 65) in her sister; Heart disease in her father; High Cholesterol in her sister; Hypertension in her mother; Lung cancer in her maternal aunt. Social History:  Patient  reports that she has never smoked. She has never used smokeless tobacco. She reports current alcohol use of about 2.0 - 3.0 standard drinks of alcohol per week. She reports that she does not use drugs.  Review of Systems: Constitutional: negative for fever or malaise Ophthalmic: negative for photophobia, double vision or loss of vision Cardiovascular: negative for chest pain, dyspnea on exertion, or new LE swelling Respiratory: negative for SOB or persistent cough Gastrointestinal: negative for abdominal pain, change in bowel habits or melena Genitourinary: negative for dysuria or gross hematuria, no abnormal uterine bleeding or disharge Musculoskeletal: negative for new gait disturbance or muscular weakness Integumentary: negative for new or persistent rashes, no breast lumps Neurological: negative for TIA or stroke symptoms Psychiatric: negative for SI or delusions Allergic/Immunologic: negative for hives  Patient Care Team    Relationship Specialty Notifications Start End  Leamon Arnt, MD PCP - General Family Medicine  07/24/19   [name], MD Consulting Physician Obstetrics and Gynecology  07/25/18     Objective  Vitals: BP 136/86   Pulse 89   Temp 97.9 F (  36.6 C) (Temporal)   Resp 15   Ht 5\' 2"  (1.575 m)   Wt 185 lb 6.4 oz (84.1 kg)   LMP 01/06/2014 (Approximate)   SpO2 97%   BMI 33.91 kg/m  General:  Well developed, well nourished, no acute distress, appears more comfortable than last year.  Able to get to the table without assistance, limping Psych:  Alert and orientedx3,normal mood and affect HEENT:  Normocephalic, atraumatic, non-icteric sclera,  supple neck without adenopathy, mass or thyromegaly Cardiovascular:  Normal S1, S2, RRR without gallop, rub or  murmur Respiratory:  Good breath sounds bilaterally, CTAB with normal respiratory effort MSK: no deformities, contusions. Joints are without erythema or swelling. Back: Negative straight leg raise bilaterally, +2 DTRs at knees and ankles, downgoing Babinski, decreased sensation of right great toe with mild weakness.  Normal on left Skin:  Warm, no rashes or suspicious lesions noted    Commons side effects, risks, benefits, and alternatives for medications and treatment plan prescribed today were discussed, and the patient expressed understanding of the given instructions. Patient is instructed to call or message via MyChart if he/she has any questions or concerns regarding our treatment plan. No barriers to understanding were identified. We discussed Red Flag symptoms and signs in detail. Patient expressed understanding regarding what to do in case of urgent or emergency type symptoms.   Medication list was reconciled, printed and provided to the patient in AVS. Patient instructions and summary information was reviewed with the patient as documented in the AVS. This note was prepared with assistance of Dragon voice recognition software. Occasional wrong-word or sound-a-like substitutions may have occurred due to the inherent limitations of voice recognition software  This visit occurred during the SARS-CoV-2 public health emergency.  Safety protocols were in place, including screening questions prior to the visit, additional usage of staff PPE, and extensive cleaning of exam room while observing appropriate contact time as indicated for disinfecting solutions.

## 2020-06-06 ENCOUNTER — Encounter: Payer: Self-pay | Admitting: Family Medicine

## 2020-06-06 ENCOUNTER — Ambulatory Visit (INDEPENDENT_AMBULATORY_CARE_PROVIDER_SITE_OTHER)
Admission: RE | Admit: 2020-06-06 | Discharge: 2020-06-06 | Disposition: A | Discharge status: Home / Self Care | Payer: 59 | Source: Ambulatory Visit | Attending: Family Medicine | Admitting: Family Medicine

## 2020-06-06 ENCOUNTER — Other Ambulatory Visit: Payer: Self-pay

## 2020-06-06 DIAGNOSIS — M5416 Radiculopathy, lumbar region: Secondary | ICD-10-CM

## 2020-06-06 DIAGNOSIS — M47816 Spondylosis without myelopathy or radiculopathy, lumbar region: Secondary | ICD-10-CM

## 2020-06-06 HISTORY — DX: Spondylosis without myelopathy or radiculopathy, lumbar region: M47.816

## 2020-06-14 ENCOUNTER — Encounter: Payer: Self-pay | Admitting: Family Medicine

## 2020-06-14 ENCOUNTER — Other Ambulatory Visit: Payer: Self-pay | Admitting: Family Medicine

## 2020-06-14 DIAGNOSIS — M4726 Other spondylosis with radiculopathy, lumbar region: Secondary | ICD-10-CM

## 2020-06-14 DIAGNOSIS — M5416 Radiculopathy, lumbar region: Secondary | ICD-10-CM

## 2020-06-14 MED ORDER — TRAMADOL HCL 50 MG PO TABS
50.0000 mg | ORAL_TABLET | Freq: Four times a day (QID) | ORAL | 0 refills | Status: DC | PRN
Start: 2020-06-14 — End: 2020-06-28

## 2020-06-20 ENCOUNTER — Encounter: Payer: Self-pay | Admitting: Family Medicine

## 2020-06-20 ENCOUNTER — Other Ambulatory Visit: Payer: Self-pay

## 2020-06-20 ENCOUNTER — Ambulatory Visit (INDEPENDENT_AMBULATORY_CARE_PROVIDER_SITE_OTHER): Payer: 59 | Admitting: Family Medicine

## 2020-06-20 DIAGNOSIS — M5441 Lumbago with sciatica, right side: Secondary | ICD-10-CM

## 2020-06-20 NOTE — Progress Notes (Signed)
Office Visit Note   Patient: Jeanne Parks           Date of Birth: August 12, 1961           MRN: [id number] Visit Date: 06/20/2020 Requested by: Leamon Arnt, [place],  [place] [address] PCP: Leamon Arnt, MD  Subjective: Chief Complaint  Patient presents with  . Lower Back - Pain    HPI: She is here at the request of Dr. Jonni Sanger for low back pain.  Symptoms started about 2 or 3 weeks ago with no definite injury.  Pain in the right lower back with radiation down the leg and into the great toe.  She has had a tingling sensation as well.  Pain is constant, seems to be better when lying on her right side.  No bowel or bladder dysfunction, no fevers or chills, no rash on her skin.  She was given gabapentin, Flexeril, and tramadol.  Tramadol seems to help the most.  She recalls being told she had scoliosis as a child but never had any pain related to it.  Her job involves being an Web designer.  She does not do any strenuous lifting.              ROS:   All other systems were reviewed and are negative.  Objective: Vital Signs: LMP 01/06/2014 (Approximate)   Physical Exam:  General:  Alert and oriented, in no acute distress. Pulm:  Breathing unlabored. Psy:  Normal mood, congruent affect. Skin: No visible rash Low back: She has moderate tenderness in the right sciatic notch.  No significant pain over the SI joint or lumbar spinous processes.  Straight leg raise is equivocal on the right, no pain with internal hip rotation.  5/5 hip flexion, knee extension, ankle plantarflexion strength on the right.  She has weakness with ankle dorsiflexion, 4/5 and also weakness with EHL testing, 4/5.  Knee and ankle DTRs are 2+.  Imaging: None taken today but recent x-rays show lumbar scoliosis with moderate to severe facet DJD at L2-3.  Assessment & Plan: 1.  Low back pain with right-sided radiculopathy and ankle dorsiflexion and EHL weakness, suspect mainly the L5  level. -Discussed options with her and elected to try physical therapy at [facility] rehab.  She will call for tramadol refills when needed.  If she fails to improve, then lumbar MRI scan followed by epidural injection if indicated.     Procedures: No procedures performed  No notes on file     PMFS History: Patient Active Problem List   Diagnosis Date Noted  . DJD (degenerative joint disease), lumbar 06/06/2020  . Mixed hyperlipidemia 09/28/2019  . Essential hypertension 09/28/2019  . Sinus tachycardia 07/24/2019  . Insulin resistance 05/25/2019  . Situational mixed anxiety and depressive disorder 02/16/2019  . Obesity (BMI 30-39.9) 08/20/2018  . Primary insomnia 08/20/2018  . OA (osteoarthritis) of right knee, s/p arthroscopy   . Seasonal allergies    Past Medical History:  Diagnosis Date  . DJD (degenerative joint disease), lumbar 06/06/2020   X-rays August 2021  . History of chicken pox   . HLD (hyperlipidemia)   . HSV-1 infection   . Insulin resistance   . OA (osteoarthritis) of right knee, s/p arthroscopy   . Seasonal allergies   . Skin cancer     Family History  Problem Relation Age of Onset  . Breast cancer Mother 23       lumpectomy and radiation; estrogen  fed  . Hypertension Mother   . Heart disease Father   . Diabetes type I Sister 54  . Lung cancer Maternal Aunt   . Cancer Paternal Uncle   . High Cholesterol Sister     Past Surgical History:  Procedure Laterality Date  . BREAST CYST EXCISION Left 10+ yrs ago  . CESAREAN SECTION  08/27/86  . DILATION AND CURETTAGE OF UTERUS  1997   Miscarriage  . KNEE ARTHROSCOPY Right 02/2015  . TONSILLECTOMY  1967   Social History   Occupational History  . Occupation: Company secretary: [employer]  Tobacco Use  . Smoking status: Never Smoker  . Smokeless tobacco: Never Used  Vaping Use  . Vaping Use: Never used  Substance and Sexual Activity  . Alcohol use: Yes     Alcohol/week: 2.0 - 3.0 standard drinks    Types: 2 - 3 Standard drinks or equivalent per week  . Drug use: No  . Sexual activity: Yes    Partners: Male    Birth control/protection: Post-menopausal

## 2020-06-20 NOTE — Progress Notes (Signed)
Right sided low back pain with right leg pain  Big toe is numb No known injury Pain for two weeks Tried tramadol, flexeril and gabapentin  Tramadol helps some Hasnt tried any PT or shots

## 2020-06-28 MED ORDER — GABAPENTIN 100 MG PO CAPS: ORAL_CAPSULE | ORAL | 3 refills | Status: DC

## 2020-06-28 MED ORDER — TRAMADOL HCL 50 MG PO TABS
50.0000 mg | ORAL_TABLET | Freq: Four times a day (QID) | ORAL | 0 refills | Status: DC | PRN
Start: 2020-06-28 — End: 2020-09-07

## 2020-06-28 MED ORDER — CYCLOBENZAPRINE HCL 10 MG PO TABS: 10.0000 mg | ORAL_TABLET | Freq: Every evening | ORAL | 3 refills | Status: DC | PRN

## 2020-07-10 ENCOUNTER — Other Ambulatory Visit: Payer: Self-pay | Admitting: Family Medicine

## 2020-07-10 DIAGNOSIS — E8881 Metabolic syndrome: Secondary | ICD-10-CM

## 2020-07-27 ENCOUNTER — Other Ambulatory Visit: Payer: Self-pay | Admitting: Family Medicine

## 2020-07-27 DIAGNOSIS — F5101 Primary insomnia: Secondary | ICD-10-CM

## 2020-09-07 ENCOUNTER — Encounter: Payer: Self-pay | Admitting: Family Medicine

## 2020-09-07 MED ORDER — TRAMADOL HCL 50 MG PO TABS
50.0000 mg | ORAL_TABLET | Freq: Four times a day (QID) | ORAL | 0 refills | Status: DC | PRN
Start: 2020-09-07 — End: 2021-05-19

## 2020-10-04 ENCOUNTER — Other Ambulatory Visit: Payer: Self-pay

## 2020-10-04 ENCOUNTER — Other Ambulatory Visit: Payer: Self-pay | Admitting: Family Medicine

## 2020-10-04 ENCOUNTER — Telehealth: Payer: Self-pay

## 2020-10-04 MED ORDER — METOPROLOL SUCCINATE ER 25 MG PO TB24: 25.0000 mg | ORAL_TABLET | Freq: Every day | ORAL | 3 refills | Status: DC

## 2020-10-04 NOTE — Telephone Encounter (Signed)
.. °  LAST APPOINTMENT DATE:06/02/20  NEXT APPOINTMENT DATE:@3 /10/2020  MEDICATION:metoprolol succinate (TOPROL-XL) 25 MG 24 hr tablet    PHARMACY:  **Let patient know to contact pharmacy at the end of the day to make sure medication is ready. **  ** Please notify patient to allow 48-72 hours to process**  **Encourage patient to contact the pharmacy for refills or they can request refills through [facility]**  CLINICAL FILLS OUT ALL BELOW:   LAST REFILL:  QTY:  REFILL DATE:    OTHER COMMENTS:    Okay for refill?  Please advise

## 2020-10-04 NOTE — Telephone Encounter (Signed)
Script sent to pharmacy.

## 2020-11-30 IMAGING — MG DIGITAL DIAGNOSTIC BILAT W/ TOMO W/ CAD
8 series · 8 of 24 positions shown · non-contrast
Comparison: Previous exam(s).

CLINICAL DATA: 59-year-old female presenting for annual exam as
well as two year follow-up of a probably benign right breast mass.

EXAM:
DIGITAL DIAGNOSTIC BILATERAL MAMMOGRAM WITH CAD AND TOMO
ULTRASOUND RIGHT BREAST

[R CC synth-2D]
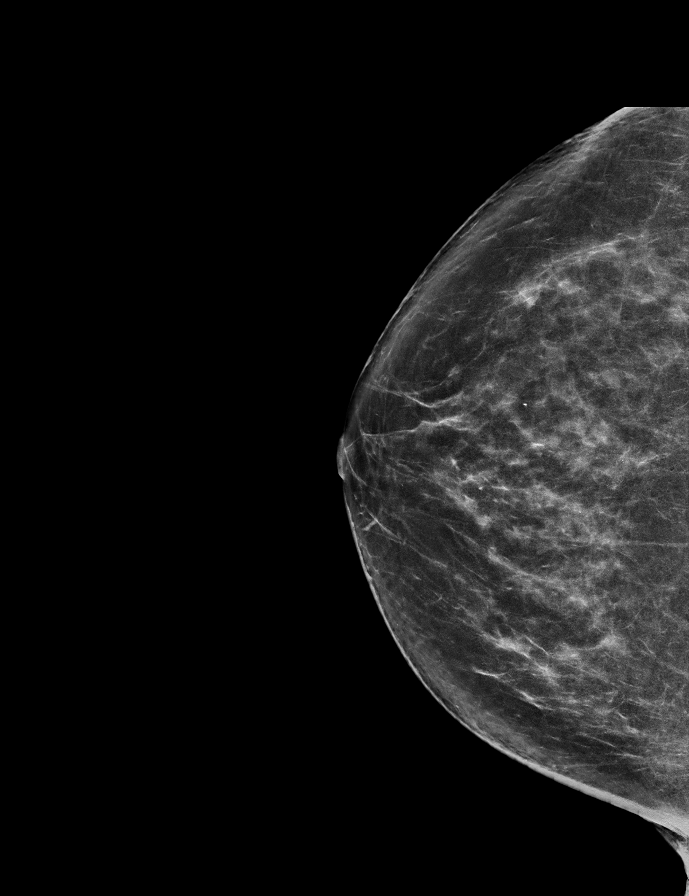

[R MLO synth-2D]
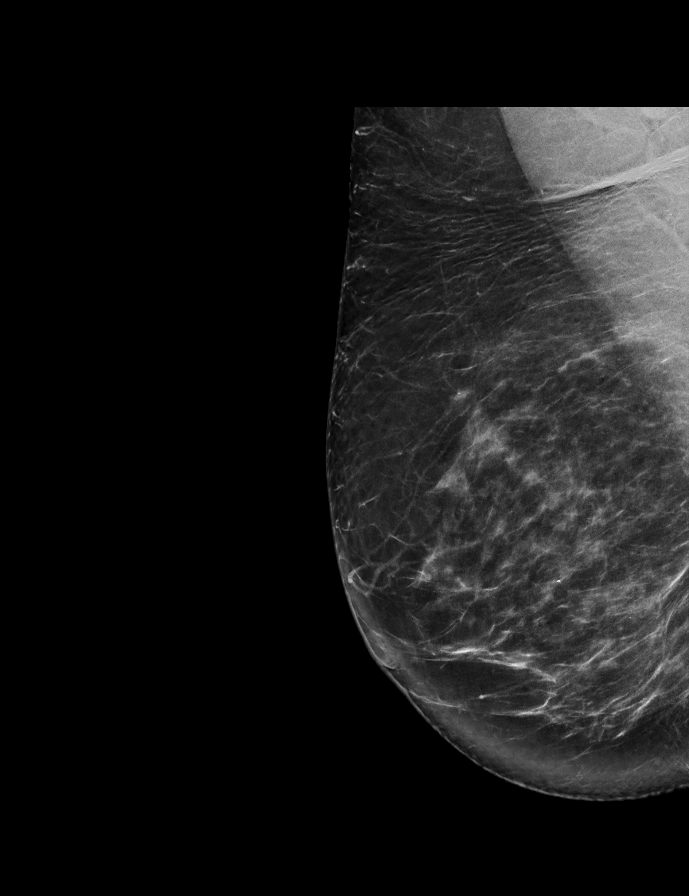

[L CC synth-2D]
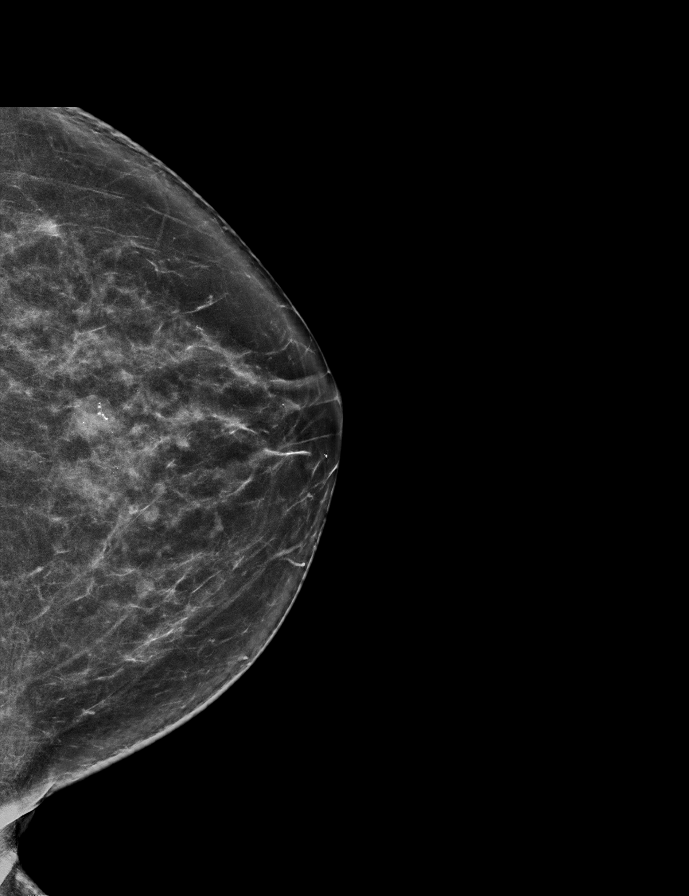

[L MLO synth-2D]
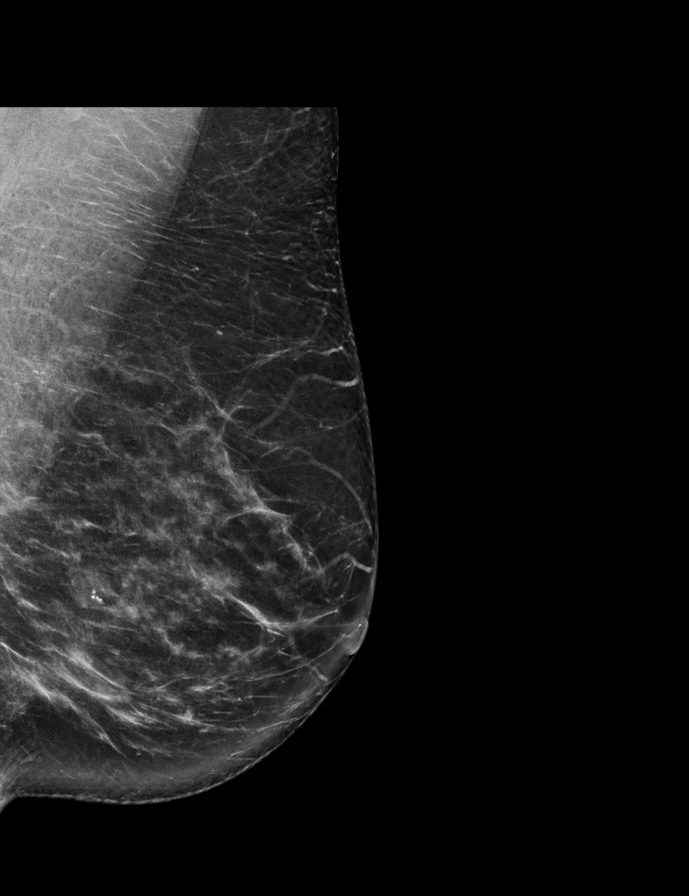

[L CC tomo · tomo slice 36/71.0]
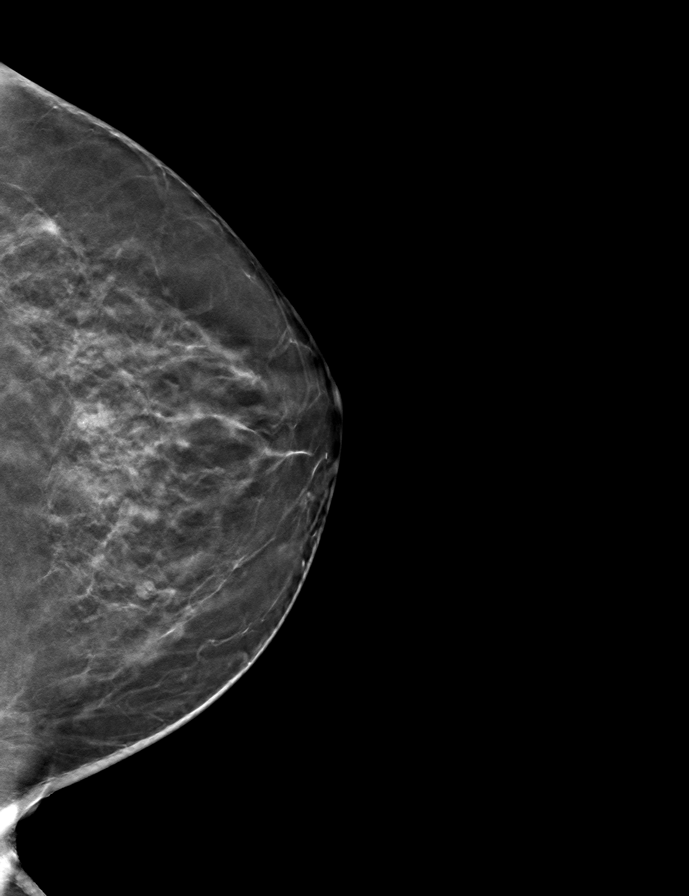

[L MLO tomo · tomo slice 39/77.0]
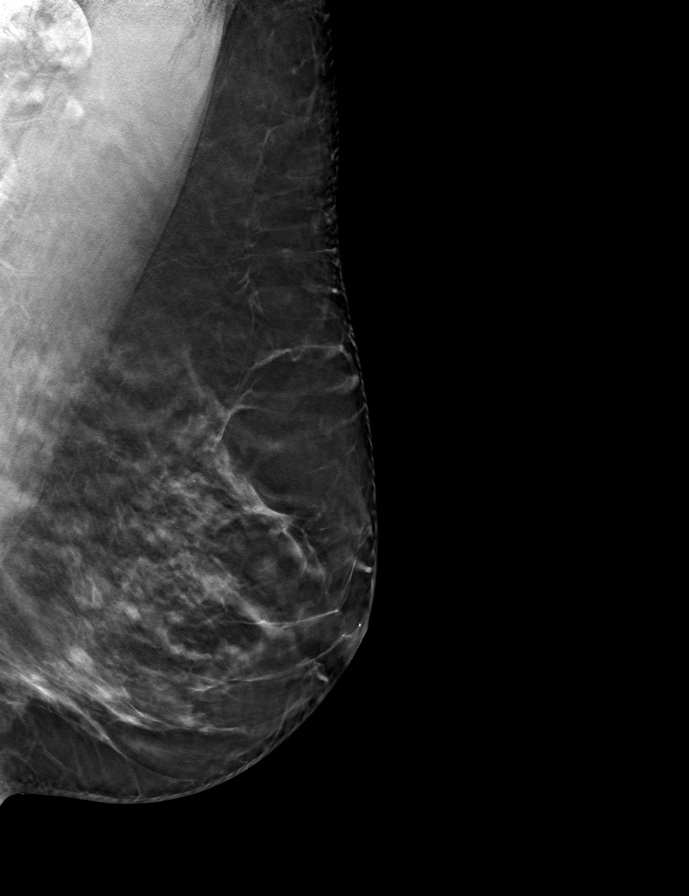

[R CC tomo · tomo slice 38/75.0]
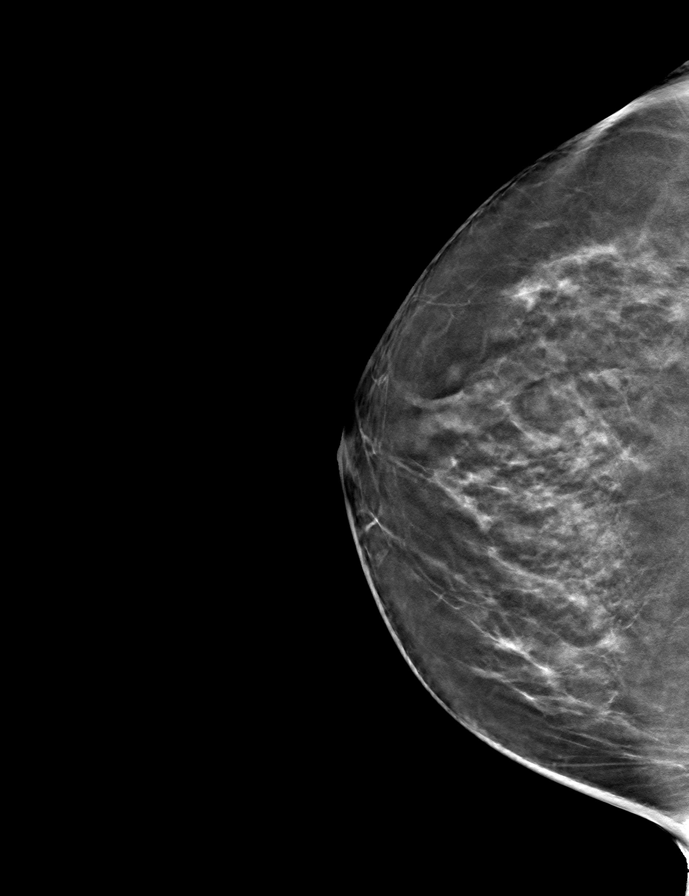

[R MLO tomo · tomo slice 41/80.0]
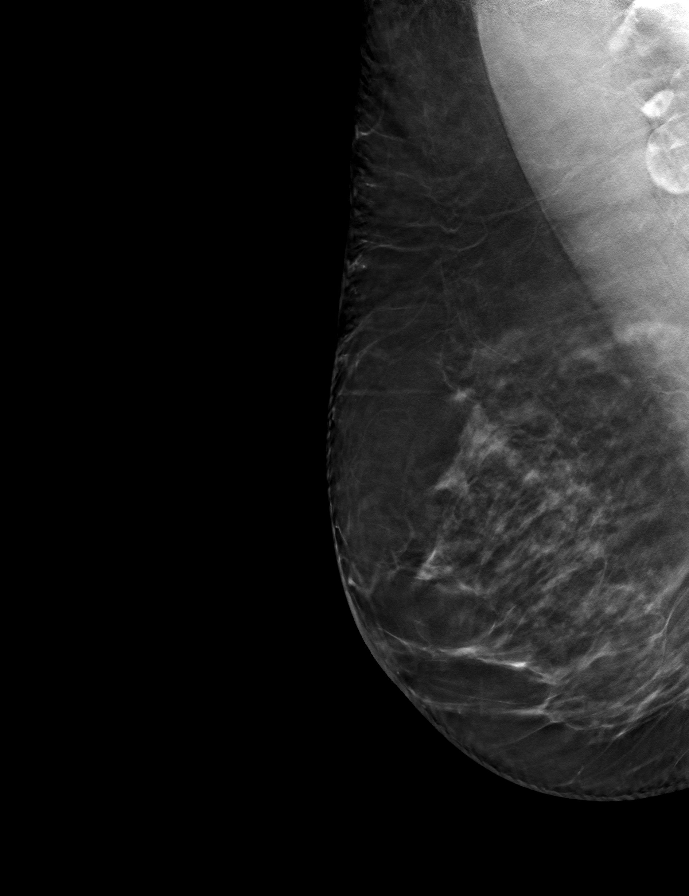

[8 of 24 positions shown; findings below may reference images not displayed]

ACR Breast Density Category c: The breast tissue is heterogeneously
dense, which may obscure small masses.
FINDINGS: Mammogram:

Right breast: Stable appearance of the previously demonstrated small
mass in the upper outer right breast, less conspicuous on the MLO
view. No new suspicious mass, distortion, or microcalcifications are
identified to suggest presence of malignancy.

Left breast: No suspicious mass, distortion, or microcalcifications
are identified to suggest presence of malignancy.

Mammographic images were processed with CAD.

Ultrasound:

Targeted ultrasound is performed in the right breast at 10 o'clock 4
cm from the nipple demonstrating an oval circumscribed hypoechoic
mass measuring 0.7 x 0.3 x 0.8 cm, previously measuring 0.8 x 0.3 x
0.8 cm. No internal blood flow identified.
IMPRESSION: 1. Stable mass in the right breast at 10 o'clock measuring 0.8 cm.
Given over 2 years of stability, this is considered a benign
process.

2.  No mammographic evidence of malignancy in the bilateral breasts.

RECOMMENDATION:
Screening mammogram in one year.(Code:S9-D-2P5)

I have discussed the findings and recommendations with the patient.
If applicable, a reminder letter will be sent to the patient
regarding the next appointment.

BI-RADS CATEGORY  2: Benign.

## 2020-12-06 ENCOUNTER — Ambulatory Visit (INDEPENDENT_AMBULATORY_CARE_PROVIDER_SITE_OTHER): Payer: BC Managed Care – PPO | Admitting: Family Medicine

## 2020-12-06 ENCOUNTER — Encounter: Payer: Self-pay | Admitting: Family Medicine

## 2020-12-06 ENCOUNTER — Other Ambulatory Visit: Payer: Self-pay

## 2020-12-06 VITALS — BP 130/82 | HR 93 | Temp 97.3°F | Wt 191.0 lb

## 2020-12-06 DIAGNOSIS — E8881 Metabolic syndrome: Secondary | ICD-10-CM | POA: Diagnosis not present

## 2020-12-06 DIAGNOSIS — M5416 Radiculopathy, lumbar region: Secondary | ICD-10-CM

## 2020-12-06 DIAGNOSIS — E88819 Insulin resistance, unspecified: Secondary | ICD-10-CM

## 2020-12-06 DIAGNOSIS — I10 Essential (primary) hypertension: Secondary | ICD-10-CM | POA: Diagnosis not present

## 2020-12-06 DIAGNOSIS — E782 Mixed hyperlipidemia: Secondary | ICD-10-CM | POA: Diagnosis not present

## 2020-12-06 DIAGNOSIS — Z1211 Encounter for screening for malignant neoplasm of colon: Secondary | ICD-10-CM | POA: Diagnosis not present

## 2020-12-06 DIAGNOSIS — Z1212 Encounter for screening for malignant neoplasm of rectum: Secondary | ICD-10-CM

## 2020-12-06 DIAGNOSIS — M4726 Other spondylosis with radiculopathy, lumbar region: Secondary | ICD-10-CM

## 2020-12-06 LAB — POCT GLYCOSYLATED HEMOGLOBIN (HGB A1C): Hemoglobin A1C: 5.7 % — AB (ref 4.0–5.6)

## 2020-12-06 NOTE — Patient Instructions (Signed)
Please return in 6 months for your annual complete physical; please come fasting.  Work on cutting back on sweet tea.  I will get the report of your MRI from Dr. Junius Roads.    I have referred you for colonoscopy and we will get this set up for you.   If you have any questions or concerns, please don't hesitate to send me a message via MyChart or call the office at [phone]. Thank you for visiting with [place] today! It's our pleasure caring for you.

## 2020-12-06 NOTE — Progress Notes (Signed)
Subjective  CC:  Chief Complaint  Patient presents with  . insulin resistance    Stopped Metformin at last visit, working on [employer] and exercise. Will check A1C today.  . Hypertension  . Back Pain    Right lower back     HPI: Jeanne Parks is a 60 y.o. female who presents to the office today for follow up of diabetes and problems listed above in the chief complaint.   IR follow up: Here for follow-up for impaired fasting glucose or insulin resistance.  She has been diet controlled for the last 6 months.  We stopped Metformin at that time.  She continues to try to eat well but admits that it has been a struggle.  Due to stressors at home and caring for her husband, she finds that eating is her enjoyable activity at this point.  She has cut down from sweetened beverages but continues to drink sweet tea once daily.  Exercise is limited.  She has no symptoms of hyperglycemia.  Hypertension on low-dose beta-blocker and doing well.  No palpitations or chest pain.  No adverse effects.  Discussed mildly elevated atherosclerotic risk score and cholesterol levels.  Not currently on statin.  Back pain: Had lumbar radiculopathy treated last September.  Had x-ray showing osteoarthritis.  Saw sports medicine on months.  Continues on gabapentin nightly and intermittent tramadol.  Still complains of pressure-like symptoms but no longer having radicular pain.  She did go to physical therapy.  Has not had any follow-up.  No bowel or bladder problems.  No further severe pain.  Tramadol intermittently has controlled pain but she still is symptomatic.   Wt Readings from Last 3 Encounters:  12/06/20 191 lb (86.6 kg)  06/02/20 185 lb 6.4 oz (84.1 kg)  12/30/19 181 lb 3.2 oz (82.2 kg)    BP Readings from Last 3 Encounters:  12/06/20 130/82  06/02/20 136/86  05/25/20 (!) 150/90    Assessment  1. Insulin resistance   2. Essential hypertension   3. Mixed hyperlipidemia   4. Screening for  colorectal cancer   5. Right lumbar radiculopathy   6. Osteoarthritis of spine with radiculopathy, lumbar region      Plan   IR: She is doing fine off of Metformin.  Reinforced healthy eating and diet.  Recommend weaning off sweetened beverages completely if able.  Recommend weight loss.  Hopefully screen time will help.  She this.  Will recheck in 6 months.  Hypertension well-controlled: Continue Toprol-XL 25 mg daily.  Mixed hyperlipidemia with borderline ASCVD score.  Recommend low-fat diet.  We will recheck lipids at next office visit.  May warrant statin if continues to elevate.  Discussed screening for colorectal cancer: Patient willing to schedule colonoscopy.  Referral placed.  She is average risk.  Right lumbar radiculopathy and osteoarthritis of spine: Continues to be active.  Recommend follow-up with sports medicine for further evaluation.  May warrant MRI or referral for further management strategies.  She continues to use tramadol in the meantime.  Continues gabapentin nightly.  Follow up: 6 months for complete physical. Orders Placed This Encounter  Procedures  . Ambulatory referral to Gastroenterology  . POCT HgB A1C   No orders of the defined types were placed in this encounter.     Immunization History  Administered Date(s) Administered  . Influenza,inj,Quad PF,6+ Mos 08/20/2018  . PFIZER(Purple Top)SARS-COV-2 Vaccination 01/23/2020, 02/15/2020, 10/26/2020  . Pneumococcal Polysaccharide-23 05/25/2019  . Tdap 10/31/2016    Diabetes Related  Lab Review: Lab Results  Component Value Date   HGBA1C 5.7 (A) 12/06/2020   HGBA1C 5.5 06/02/2020   HGBA1C 5.9 05/25/2019    No results found for: Derl Barrow Lab Results  Component Value Date   CREATININE 1.04 06/02/2020   BUN 21 06/02/2020   NA 137 06/02/2020   K 4.0 06/02/2020   CL 98 06/02/2020   CO2 33 (H) 06/02/2020   Lab Results  Component Value Date   CHOL 200 (H) 06/02/2020   CHOL 204 (H)  05/25/2019   CHOL 196 11/06/2017   Lab Results  Component Value Date   HDL 60 06/02/2020   HDL 52.50 05/25/2019   HDL 45 11/06/2017   Lab Results  Component Value Date   LDLCALC 124 (H) 06/02/2020   LDLCALC 136 (H) 05/25/2019   LDLCALC 128 (H) 11/06/2017   Lab Results  Component Value Date   TRIG 70 06/02/2020   TRIG 75.0 05/25/2019   TRIG 115 11/06/2017   Lab Results  Component Value Date   CHOLHDL 3.3 06/02/2020   CHOLHDL 4 05/25/2019   CHOLHDL 4.4 11/06/2017   No results found for: LDLDIRECT The 10-year ASCVD risk score Mikey Bussing DC Jr., et al., 2013) is: 6.6%   Values used to calculate the score:     Age: 56 years     Sex: Female     Is Non-Hispanic African American: No     Diabetic: Yes     Tobacco smoker: No     Systolic Blood Pressure: 474 mmHg     Is BP treated: Yes     HDL Cholesterol: 60 mg/dL     Total Cholesterol: 200 mg/dL I have reviewed the [place], Fam and Soc history. Patient Active Problem List   Diagnosis Date Noted  . DJD (degenerative joint disease), lumbar 06/06/2020    X-rays August 2021   . Mixed hyperlipidemia 09/28/2019  . Essential hypertension 09/28/2019  . Sinus tachycardia 07/24/2019  . Insulin resistance 05/25/2019  . Situational mixed anxiety and depressive disorder 02/16/2019  . Obesity (BMI 30-39.9) 08/20/2018  . Primary insomnia 08/20/2018  . OA (osteoarthritis) of right knee, s/p arthroscopy   . Seasonal allergies     Social History: Patient  reports that she has never smoked. She has never used smokeless tobacco. She reports current alcohol use of about 2.0 - 3.0 standard drinks of alcohol per week. She reports that she does not use drugs.  Review of Systems: Ophthalmic: negative for eye pain, loss of vision or double vision Cardiovascular: negative for chest pain Respiratory: negative for SOB or persistent cough Gastrointestinal: negative for abdominal pain Genitourinary: negative for dysuria or gross hematuria MSK:  negative for foot lesions Neurologic: negative for weakness or gait disturbance  Objective  Vitals: BP 130/82   Pulse 93   Temp (!) 97.3 F (36.3 C) (Temporal)   Wt 191 lb (86.6 kg)   LMP 01/06/2014 (Approximate)   SpO2 99%   BMI 34.93 kg/m  General: well appearing, no acute distress  Psych:  Alert and oriented, normal mood and affect HEENT:  Normocephalic, atraumatic, moist mucous membranes, supple neck  Cardiovascular:  Nl S1 and S2, RRR without murmur, gallop or rub. no edema Diabetic education: ongoing education regarding chronic disease management for diabetes was given today. We continue to reinforce the ABC's of diabetic management: A1c (<7 or 8 dependent upon patient), tight blood pressure control, and cholesterol management with goal LDL < 100 minimally. We discuss diet strategies, exercise recommendations, medication  options and possible side effects. At each visit, we review recommended immunizations and preventive care recommendations for diabetics and stress that good diabetic control can prevent other problems. See below for this patient's data.    Commons side effects, risks, benefits, and alternatives for medications and treatment plan prescribed today were discussed, and the patient expressed understanding of the given instructions. Patient is instructed to call or message via MyChart if he/she has any questions or concerns regarding our treatment plan. No barriers to understanding were identified. We discussed Red Flag symptoms and signs in detail. Patient expressed understanding regarding what to do in case of urgent or emergency type symptoms.   Medication list was reconciled, printed and provided to the patient in AVS. Patient instructions and summary information was reviewed with the patient as documented in the AVS. This note was prepared with assistance of Dragon voice recognition software. Occasional wrong-word or sound-a-like substitutions may have occurred due to  the inherent limitations of voice recognition software  This visit occurred during the SARS-CoV-2 public health emergency.  Safety protocols were in place, including screening questions prior to the visit, additional usage of staff PPE, and extensive cleaning of exam room while observing appropriate contact time as indicated for disinfecting solutions.

## 2020-12-07 ENCOUNTER — Encounter: Payer: Self-pay | Admitting: Family Medicine

## 2020-12-07 MED ORDER — GABAPENTIN 100 MG PO CAPS: 300.0000 mg | ORAL_CAPSULE | Freq: Every day | ORAL | 3 refills | Status: DC

## 2020-12-18 DIAGNOSIS — Z03818 Encounter for observation for suspected exposure to other biological agents ruled out: Secondary | ICD-10-CM | POA: Diagnosis not present

## 2020-12-18 DIAGNOSIS — Z20822 Contact with and (suspected) exposure to covid-19: Secondary | ICD-10-CM | POA: Diagnosis not present

## 2020-12-20 DIAGNOSIS — Z03818 Encounter for observation for suspected exposure to other biological agents ruled out: Secondary | ICD-10-CM | POA: Diagnosis not present

## 2020-12-20 DIAGNOSIS — Z20822 Contact with and (suspected) exposure to covid-19: Secondary | ICD-10-CM | POA: Diagnosis not present

## 2020-12-29 ENCOUNTER — Ambulatory Visit: Payer: 59 | Admitting: Obstetrics and Gynecology

## 2021-04-11 ENCOUNTER — Encounter: Payer: Self-pay | Admitting: Family Medicine

## 2021-04-11 MED ORDER — GABAPENTIN 100 MG PO CAPS: 300.0000 mg | ORAL_CAPSULE | Freq: Every day | ORAL | 3 refills | Status: DC

## 2021-04-17 ENCOUNTER — Ambulatory Visit (AMBULATORY_SURGERY_CENTER): Payer: BC Managed Care – PPO

## 2021-04-17 ENCOUNTER — Other Ambulatory Visit: Payer: Self-pay

## 2021-04-17 VITALS — Ht 62.0 in | Wt 189.0 lb

## 2021-04-17 DIAGNOSIS — Z1211 Encounter for screening for malignant neoplasm of colon: Secondary | ICD-10-CM

## 2021-04-17 MED ORDER — SUTAB 1479-225-188 MG PO TABS: 12.0000 | ORAL_TABLET | ORAL | 0 refills | Status: DC

## 2021-04-17 NOTE — Progress Notes (Signed)
No egg or soy allergy known to patient  No issues with past sedation with any surgeries or procedures Patient denies ever being told they had issues or difficulty with intubation  No FH of Malignant Hyperthermia No diet pills per patient No home 02 use per patient  No blood thinners per patient  Pt denies issues with constipation  No A fib or A flutter  EMMI video to pt or via [place] 19 guidelines implemented in [place] today with Pt and RN  Pt is fully vaccinated  for [employer] given to pt in PV today , Code to Pharmacy and  NO PA's for preps discussed with pt In PV today  Discussed with pt there will be an out-of-pocket cost for prep and that varies from $0 to 70 dollars   Due to the COVID-19 pandemic we are asking patients to follow certain guidelines.  Pt aware of COVID protocols and [facility] guidelines

## 2021-05-01 ENCOUNTER — Other Ambulatory Visit: Payer: Self-pay

## 2021-05-01 ENCOUNTER — Ambulatory Visit (AMBULATORY_SURGERY_CENTER): Payer: BC Managed Care – PPO | Admitting: Gastroenterology

## 2021-05-01 ENCOUNTER — Encounter: Payer: Self-pay | Admitting: Gastroenterology

## 2021-05-01 VITALS — BP 107/57 | HR 72 | Temp 97.5°F | Resp 13 | Ht 62.0 in | Wt 189.0 lb

## 2021-05-01 DIAGNOSIS — D120 Benign neoplasm of cecum: Secondary | ICD-10-CM | POA: Diagnosis not present

## 2021-05-01 DIAGNOSIS — Z8371 Family history of colonic polyps: Secondary | ICD-10-CM

## 2021-05-01 DIAGNOSIS — D125 Benign neoplasm of sigmoid colon: Secondary | ICD-10-CM

## 2021-05-01 DIAGNOSIS — Z1211 Encounter for screening for malignant neoplasm of colon: Secondary | ICD-10-CM

## 2021-05-01 MED ORDER — SODIUM CHLORIDE 0.9 % IV SOLN: 500.0000 mL | Freq: Once | INTRAVENOUS | Status: DC

## 2021-05-01 NOTE — Patient Instructions (Signed)
YOU HAD AN ENDOSCOPIC PROCEDURE TODAY AT THE [facility]:   Refer to the procedure report that was given to you for any specific questions about what was found during the examination.  If the procedure report does not answer your questions, please call your gastroenterologist to clarify.  If you requested that your care partner not be given the details of your procedure findings, then the procedure report has been included in a sealed envelope for you to review at your convenience later.  YOU SHOULD EXPECT: Some feelings of bloating in the abdomen. Passage of more gas than usual.  Walking can help get rid of the air that was put into your GI tract during the procedure and reduce the bloating. If you had a lower endoscopy (such as a colonoscopy or flexible sigmoidoscopy) you may notice spotting of blood in your stool or on the toilet paper. If you underwent a bowel prep for your procedure, you may not have a normal bowel movement for a few days.  Please Note:  You might notice some irritation and congestion in your nose or some drainage.  This is from the oxygen used during your procedure.  There is no need for concern and it should clear up in a day or so.  SYMPTOMS TO REPORT IMMEDIATELY:   Following lower endoscopy (colonoscopy or flexible sigmoidoscopy):  Excessive amounts of blood in the stool  Significant tenderness or worsening of abdominal pains  Swelling of the abdomen that is new, acute  Fever of 100F or higher   Following upper endoscopy (EGD)  Vomiting of blood or coffee ground material  New chest pain or pain under the shoulder blades  Painful or persistently difficult swallowing  New shortness of breath  Fever of 100F or higher  Black, tarry-looking stools  For urgent or emergent issues, a gastroenterologist can be reached at any hour by calling [phone]. Do not use MyChart messaging for urgent concerns.    DIET:  We do recommend a small meal at first, but  then you may proceed to your regular diet.  Drink plenty of fluids but you should avoid alcoholic beverages for 24 hours.  ACTIVITY:  You should plan to take it easy for the rest of today and you should NOT DRIVE or use heavy machinery until tomorrow (because of the sedation medicines used during the test).    FOLLOW UP: Our staff will call the number listed on your records 48-72 hours following your procedure to check on you and address any questions or concerns that you may have regarding the information given to you following your procedure. If we do not reach you, we will leave a message.  We will attempt to reach you two times.  During this call, we will ask if you have developed any symptoms of COVID 19. If you develop any symptoms (ie: fever, flu-like symptoms, shortness of breath, cough etc.) before then, please call [phone].  If you test positive for Covid 19 in the 2 weeks post procedure, please call and report this information to us.    If any biopsies were taken you will be contacted by phone or by letter within the next 1-3 weeks.  Please call us at [phone] if you have not heard about the biopsies in 3 weeks.    SIGNATURES/CONFIDENTIALITY: You and/or your care partner have signed paperwork which will be entered into your electronic medical record.  These signatures attest to the fact that that the information above on   your After Visit Summary has been reviewed and is understood.  Full responsibility of the confidentiality of this discharge information lies with you and/or your care-partner. 

## 2021-05-01 NOTE — Progress Notes (Signed)
Called to room to assist during endoscopic procedure.  Patient ID and intended procedure confirmed with present staff. Received instructions for my participation in the procedure from the performing physician.  

## 2021-05-01 NOTE — Progress Notes (Signed)
Pt. Reports no change in her medical or surgical history since her pre-visit 05/01/2021.

## 2021-05-01 NOTE — Progress Notes (Signed)
pt tolerated well. VSS. awake and to recovery. Report given to RN.  

## 2021-05-01 NOTE — Op Note (Signed)
[place] Patient Name: Jeanne Parks Procedure Date: 05/01/2021 9:57 AM MRN: [id number] Endoscopist: Thornton Park MD, MD Age: 60 Referring MD:  Date of Birth: 1961/05/26 Gender: Female Account #: [id number] Procedure:                Colonoscopy Indications:              Screening for colorectal malignant neoplasm, This                            is the patient's first colonoscopy                           Father with colon polyps Medicines:                Monitored Anesthesia Care Procedure:                Pre-Anesthesia Assessment:                           - Prior to the procedure, a History and Physical                            was performed, and patient medications and                            allergies were reviewed. The patient's tolerance of                            previous anesthesia was also reviewed. The risks                            and benefits of the procedure and the sedation                            options and risks were discussed with the patient.                            All questions were answered, and informed consent                            was obtained. Prior Anticoagulants: The patient has                            taken no previous anticoagulant or antiplatelet                            agents. ASA Grade Assessment: II - A patient with                            mild systemic disease. After reviewing the risks                            and benefits, the patient was deemed in  satisfactory condition to undergo the procedure.                           After obtaining informed consent, the colonoscope                            was passed under direct vision. Throughout the                            procedure, the patient's blood pressure, pulse, and                            oxygen saturations were monitored continuously. The                            Olympus CF-HQ190L [id number]) Colonoscope was                             introduced through the anus and advanced to the 3                            cm into the ileum. A second forward view of the                            right colon was performed. The colonoscopy was                            performed without difficulty. The patient tolerated                            the procedure well. The quality of the bowel                            preparation was good. The terminal ileum, ileocecal                            valve, appendiceal orifice, and rectum were                            photographed. Scope In: 10:02:18 AM Scope Out: 10:15:23 AM Scope Withdrawal Time: 0 hours 11 minutes 7 seconds  Total Procedure Duration: 0 hours 13 minutes 5 seconds  Findings:                 The perianal and digital rectal examinations were                            normal.                           Multiple small and large-mouthed diverticula were                            found in the sigmoid colon and descending colon.  A 2 mm polyp was found in the sigmoid colon. The                            polyp was flat. The polyp was removed with a cold                            snare. Resection and retrieval were complete.                            Estimated blood loss was minimal.                           A 1 mm polyp was found in the cecum. The polyp was                            flat. The polyp was removed with a cold snare.                            Resection and retrieval were complete. Estimated                            blood loss was minimal.                           The exam was otherwise without abnormality on                            direct and retroflexion views. Complications:            No immediate complications. Estimated blood loss:                            Minimal. Estimated Blood Loss:     Estimated blood loss: none. Impression:               - Diverticulosis in the sigmoid colon and in the                             descending colon.                           - One 2 mm polyp in the sigmoid colon, removed with                            a cold snare. Resected and retrieved.                           - One 1 mm polyp in the cecum, removed with a cold                            snare. Resected and retrieved.                           - The examination was otherwise normal on direct  and retroflexion views. Recommendation:           - Patient has a contact number available for                            emergencies. The signs and symptoms of potential                            delayed complications were discussed with the                            patient. Return to normal activities tomorrow.                            Written discharge instructions were provided to the                            patient.                           - High fiber diet.                           - Continue present medications.                           - Await pathology results.                           - Repeat colonoscopy date to be determined after                            pending pathology results are reviewed for                            surveillance.                           - Emerging evidence supports eating a diet of                            fruits, vegetables, grains, calcium, and yogurt                            while reducing red meat and alcohol may reduce the                            risk of colon cancer.                           - Thank you for allowing me to be involved in your                            colon cancer prevention. Thornton Park MD, MD 05/01/2021 10:20:22 AM This report has been signed electronically.

## 2021-05-03 ENCOUNTER — Telehealth: Payer: Self-pay

## 2021-05-03 NOTE — Telephone Encounter (Signed)
LVM

## 2021-05-08 ENCOUNTER — Encounter: Payer: Self-pay | Admitting: Gastroenterology

## 2021-05-18 ENCOUNTER — Encounter: Payer: Self-pay | Admitting: Family Medicine

## 2021-05-19 MED ORDER — TRAMADOL HCL 50 MG PO TABS: 50.0000 mg | ORAL_TABLET | Freq: Four times a day (QID) | ORAL | 0 refills | Status: DC | PRN

## 2021-06-09 ENCOUNTER — Encounter: Payer: BC Managed Care – PPO | Admitting: Family Medicine

## 2021-08-01 ENCOUNTER — Other Ambulatory Visit: Payer: Self-pay

## 2021-08-01 ENCOUNTER — Ambulatory Visit (INDEPENDENT_AMBULATORY_CARE_PROVIDER_SITE_OTHER): Payer: BC Managed Care – PPO | Admitting: Family Medicine

## 2021-08-01 ENCOUNTER — Encounter: Payer: Self-pay | Admitting: Family Medicine

## 2021-08-01 VITALS — BP 130/78 | HR 98 | Temp 97.6°F | Ht 62.0 in | Wt 187.0 lb

## 2021-08-01 DIAGNOSIS — E8881 Metabolic syndrome: Secondary | ICD-10-CM | POA: Diagnosis not present

## 2021-08-01 DIAGNOSIS — E88819 Insulin resistance, unspecified: Secondary | ICD-10-CM

## 2021-08-01 DIAGNOSIS — E669 Obesity, unspecified: Secondary | ICD-10-CM | POA: Diagnosis not present

## 2021-08-01 DIAGNOSIS — F4323 Adjustment disorder with mixed anxiety and depressed mood: Secondary | ICD-10-CM

## 2021-08-01 DIAGNOSIS — M4726 Other spondylosis with radiculopathy, lumbar region: Secondary | ICD-10-CM

## 2021-08-01 DIAGNOSIS — Z23 Encounter for immunization: Secondary | ICD-10-CM | POA: Diagnosis not present

## 2021-08-01 DIAGNOSIS — Z0000 Encounter for general adult medical examination without abnormal findings: Secondary | ICD-10-CM | POA: Diagnosis not present

## 2021-08-01 DIAGNOSIS — R000 Tachycardia, unspecified: Secondary | ICD-10-CM | POA: Diagnosis not present

## 2021-08-01 DIAGNOSIS — Z0001 Encounter for general adult medical examination with abnormal findings: Secondary | ICD-10-CM

## 2021-08-01 DIAGNOSIS — E782 Mixed hyperlipidemia: Secondary | ICD-10-CM

## 2021-08-01 DIAGNOSIS — F5101 Primary insomnia: Secondary | ICD-10-CM

## 2021-08-01 DIAGNOSIS — I10 Essential (primary) hypertension: Secondary | ICD-10-CM | POA: Diagnosis not present

## 2021-08-01 LAB — COMPREHENSIVE METABOLIC PANEL
ALT: 17 U/L (ref 0–35)
AST: 17 U/L (ref 0–37)
Albumin: 4.3 g/dL (ref 3.5–5.2)
Alkaline Phosphatase: 110 U/L (ref 39–117)
BUN: 16 mg/dL (ref 6–23)
CO2: 30 mEq/L (ref 19–32)
Calcium: 9.7 mg/dL (ref 8.4–10.5)
Chloride: 103 mEq/L (ref 96–112)
Creatinine, Ser: 1.03 mg/dL (ref 0.40–1.20)
GFR: 59.5 mL/min — ABNORMAL LOW (ref >60.00)
Glucose, Bld: 113 mg/dL — ABNORMAL HIGH (ref 70–99)
Potassium: 3.8 mEq/L (ref 3.5–5.1)
Sodium: 139 mEq/L (ref 135–145)
Total Bilirubin: 0.6 mg/dL (ref 0.2–1.2)
Total Protein: 6.9 g/dL (ref 6.0–8.3)

## 2021-08-01 LAB — CBC WITH DIFFERENTIAL/PLATELET
Basophils Absolute: 0 10*3/uL (ref 0.0–0.1)
Basophils Relative: 0.4 % (ref 0.0–3.0)
Eosinophils Absolute: 0 10*3/uL (ref 0.0–0.7)
Eosinophils Relative: 0.5 % (ref 0.0–5.0)
HCT: 39.9 % (ref 36.0–46.0)
Hemoglobin: 13.2 g/dL (ref 12.0–15.0)
Lymphocytes Relative: 18.6 % (ref 12.0–46.0)
Lymphs Abs: 1.3 10*3/uL (ref 0.7–4.0)
MCHC: 33.1 g/dL (ref 30.0–36.0)
MCV: 91.5 fl (ref 78.0–100.0)
Monocytes Absolute: 0.5 10*3/uL (ref 0.1–1.0)
Monocytes Relative: 7.4 % (ref 3.0–12.0)
Neutro Abs: 5 10*3/uL (ref 1.4–7.7)
Neutrophils Relative %: 73.1 % (ref 43.0–77.0)
Platelets: 258 10*3/uL (ref 150.0–400.0)
RBC: 4.36 Mil/uL (ref 3.87–5.11)
RDW: 13.5 % (ref 11.5–15.5)
WBC: 6.9 10*3/uL (ref 4.0–10.5)

## 2021-08-01 LAB — LIPID PANEL
Cholesterol: 212 mg/dL — ABNORMAL HIGH (ref 0–200)
HDL: 58.9 mg/dL (ref >39.00)
LDL Cholesterol: 130 mg/dL — ABNORMAL HIGH (ref 0–99)
NonHDL: 152.61
Total CHOL/HDL Ratio: 4
Triglycerides: 114 mg/dL (ref 0.0–149.0)
VLDL: 22.8 mg/dL (ref 0.0–40.0)

## 2021-08-01 LAB — HEMOGLOBIN A1C: Hgb A1c MFr Bld: 5.9 % (ref 4.6–6.5)

## 2021-08-01 MED ORDER — TRAZODONE HCL 100 MG PO TABS: 100.0000 mg | ORAL_TABLET | Freq: Every day | ORAL | 3 refills | Status: DC

## 2021-08-01 MED ORDER — METOPROLOL SUCCINATE ER 25 MG PO TB24: 25.0000 mg | ORAL_TABLET | Freq: Every day | ORAL | 3 refills | Status: DC

## 2021-08-01 NOTE — Progress Notes (Signed)
Subjective  Chief Complaint  Patient presents with   Annual Exam   Hypertension   Hyperlipidemia   Depression    HPI: Jeanne Parks is a 60 y.o. female who presents to [facility] [place] today for a Female Wellness Visit. She also has the concerns and/or needs as listed above in the chief complaint. These will be addressed in addition to the Health Maintenance Visit.   Wellness Visit: annual visit with health maintenance review and exam without Pap  HM: overdue for GYN visit; due mammo. Will schedule. Has had hectic year: husband's kidney transplant failed and now back on dialysis. Sold home and moved in with daughter. Building apartment for them. But overall, says she is coping Chronic disease f/u and/or acute problem visit: (deemed necessary to be done in addition to the wellness visit): Adjustment anxiety: request prn xanax.uses infrequently. No h/o mood d/o HTN and palpitations: Feeling well. Taking medications w/o adverse effects. No symptoms of CHF, angina; no palpitations, sob, cp or lower extremity edema. Compliant with meds.  HLD: borderline elevated. Not yet on statin. Non fastingfor labs today IR: h/o IR. Due for recheck. No sxs of hyperglycemia Lumbar radiculopathy: much improved overall but still with flares. Prn tramadol helps. Uses gabapentin nightly. No new sxs. Had had SM eval with MRI.  Insomnia: fairly well controlled on trazadone 100mg  nightly. Sleep is less good but due to stress of husband's illness.   Assessment  1. Annual physical exam   2. Essential hypertension   3. Mixed hyperlipidemia   4. Insulin resistance   5. Obesity (BMI 30-39.9)   6. Primary insomnia   7. Osteoarthritis of spine with radiculopathy, lumbar region   8. Situational mixed anxiety and depressive disorder   9. Sinus tachycardia      Plan  Female Wellness Visit: Age appropriate Health Maintenance and Prevention measures were discussed with patient. Included  topics are cancer screening recommendations, ways to keep healthy (see AVS) including dietary and exercise recommendations, regular eye and dental care, use of seat belts, and avoidance of moderate alcohol use and tobacco use. To see gyn and get mammo and gyn exam. Rec eye exam BMI: discussed patient's BMI and encouraged positive lifestyle modifications to help get to or maintain a target BMI. HM needs and immunizations were addressed and ordered. See below for orders. See HM and immunization section for updates.flu and 1st shingrix today Routine labs and screening tests ordered including cmp, cbc and lipids where appropriate. Discussed recommendations regarding Vit D and calcium supplementation (see AVS)  Chronic disease management visit and/or acute problem visit: HTN: controlled on metoprolol 25 daily; sinus tach is controlled as well.  HLD: recheck lipids. May warrant statin.  FM:[id number] to see if has resolved.  Lumbar radiculopathy; continue gabapentin 200-300 nightly. May be able to stop in future. Prn tramadol Insomnia: continue trazadone 100mg  nightly.  Monitor mood; counseling done. Overall stable. Prn xanax ordered.   Follow up: Return in about 6 months (around 01/30/2022) for follow up Hypertension.  Orders Placed This Encounter  Procedures   CBC with Differential/Platelet   Comprehensive metabolic panel   Hemoglobin A1c   Lipid panel   No orders of the defined types were placed in this encounter.      Body mass index is 34.2 kg/m. Wt Readings from Last 3 Encounters:  08/01/21 187 lb (84.8 kg)  05/01/21 189 lb (85.7 kg)  04/17/21 189 lb (85.7 kg)     Patient Active Problem  List   Diagnosis Date Noted   DJD (degenerative joint disease), lumbar 06/06/2020    X-rays August 2021    Mixed hyperlipidemia 09/28/2019   Essential hypertension 09/28/2019   Sinus tachycardia 07/24/2019   Insulin resistance 05/25/2019   Situational mixed anxiety and depressive disorder  02/16/2019   Obesity (BMI 30-39.9) 08/20/2018   Primary insomnia 08/20/2018   OA (osteoarthritis) of right knee, s/p arthroscopy    Seasonal allergies    Health Maintenance  Topic Date Due   Zoster Vaccines- Shingrix (1 of 2) Never done   Pneumococcal Vaccine 31-65 Years old (2 - PCV) 05/24/2020   COVID-19 Vaccine (4 - Booster for Pfizer series) 12/21/2020   MAMMOGRAM  01/26/2021   PAP SMEAR-Modifier  12/04/2023   TETANUS/TDAP  10/31/2026   COLONOSCOPY (Pts 45-47yrs Insurance coverage will need to be confirmed)  05/02/2031   HPV VACCINES  Aged Out   INFLUENZA VACCINE  Discontinued   URINE MICROALBUMIN  Discontinued   Immunization History  Administered Date(s) Administered   Influenza,inj,Quad PF,6+ Mos 08/20/2018   PFIZER(Purple Top)SARS-COV-2 Vaccination 01/23/2020, 02/15/2020, 10/26/2020   Pneumococcal Polysaccharide-23 05/25/2019   Tdap 10/31/2016   We updated and reviewed the patient's past history in detail and it is documented below. Allergies: Patient has No Known Allergies. Past Medical History Patient  has a past medical history of Allergy, DJD (degenerative joint disease), lumbar (06/06/2020), History of chicken pox, HLD (hyperlipidemia), HSV-1 infection, Hypertension, Insulin resistance, OA (osteoarthritis) of right knee, s/p arthroscopy, Seasonal allergies, and Skin cancer. Past Surgical History Patient  has a past surgical history that includes Cesarean section (08/27/86); Dilation and curettage of uterus (1997); Knee arthroscopy (Right, 02/2015); Tonsillectomy (1967); and Breast cyst excision (Left, 10+ yrs ago). Family History: Patient family history includes Breast cancer (age of onset: 73) in her mother; Cancer in her paternal uncle; Colon polyps in her father; Diabetes type I (age of onset: 35) in her sister; Heart disease in her father; High Cholesterol in her sister; Hypertension in her mother; Lung cancer in her maternal aunt. Social History:  Patient  reports  that she has never smoked. She has been exposed to tobacco smoke. She has never used smokeless tobacco. She reports current alcohol use of about 2.0 - 3.0 standard drinks per week. She reports that she does not use drugs.  Review of Systems: Constitutional: negative for fever or malaise Ophthalmic: negative for photophobia, double vision or loss of vision Cardiovascular: negative for chest pain, dyspnea on exertion, or new LE swelling Respiratory: negative for SOB or persistent cough Gastrointestinal: negative for abdominal pain, change in bowel habits or melena Genitourinary: negative for dysuria or gross hematuria, no abnormal uterine bleeding or disharge Musculoskeletal: negative for new gait disturbance or muscular weakness Integumentary: negative for new or persistent rashes, no breast lumps Neurological: negative for TIA or stroke symptoms Psychiatric: negative for SI or delusions Allergic/Immunologic: negative for hives  Patient Care Team    Relationship Specialty Notifications Start End  Leamon Arnt, MD PCP - General Family Medicine  07/24/19   [name], MD Consulting Physician Obstetrics and Gynecology  07/25/18   Jacquelynn Cree, PT Physical Therapist Physical Therapy  06/20/20     Objective  Vitals: BP 130/78   Pulse 98   Temp 97.6 F (36.4 C) (Temporal)   Ht 5\' 2"  (1.575 m)   Wt 187 lb (84.8 kg)   LMP 01/06/2014 (Approximate)   SpO2 99%   BMI 34.20 kg/m  General:  Well developed,  well nourished, no acute distress  Psych:  Alert and orientedx3,normal mood and affect HEENT:  Normocephalic, atraumatic, non-icteric sclera,  supple neck without adenopathy, mass or thyromegaly Cardiovascular:  Normal S1, S2, RRR without gallop, rub or murmur Respiratory:  Good breath sounds bilaterally, CTAB with normal respiratory effort Gastrointestinal: normal bowel sounds, soft, non-tender, no noted masses. No HSM MSK: no deformities, contusions. Joints are without  erythema or swelling.  Skin:  Warm, no rashes or suspicious lesions noted   Commons side effects, risks, benefits, and alternatives for medications and treatment plan prescribed today were discussed, and the patient expressed understanding of the given instructions. Patient is instructed to call or message via MyChart if he/she has any questions or concerns regarding our treatment plan. No barriers to understanding were identified. We discussed Red Flag symptoms and signs in detail. Patient expressed understanding regarding what to do in case of urgent or emergency type symptoms.  Medication list was reconciled, printed and provided to the patient in AVS. Patient instructions and summary information was reviewed with the patient as documented in the AVS. This note was prepared with assistance of Dragon voice recognition software. Occasional wrong-word or sound-a-like substitutions may have occurred due to the inherent limitations of voice recognition software  This visit occurred during the SARS-CoV-2 public health emergency.  Safety protocols were in place, including screening questions prior to the visit, additional usage of staff PPE, and extensive cleaning of exam room while observing appropriate contact time as indicated for disinfecting solutions.

## 2021-08-01 NOTE — Addendum Note (Signed)
Addended by: Gean Birchwood on: 08/01/2021 10:33 AM   Modules accepted: Orders

## 2021-08-01 NOTE — Patient Instructions (Addendum)
Please return in 6 months for hypertension follow up and 2nd Shingrix vaccination.  I will release your lab results to you on your MyChart account with further instructions. Please reply with any questions.    Today you were given your flu and 1st of 2 Shingrix vaccinations.    Please schedule an eye exam and an appointment with Dr. Talbert Nan.  If you have any questions or concerns, please don't hesitate to send me a message via MyChart or call the office at [phone]. Thank you for visiting with Jeanne Parks today! It's our pleasure caring for you.

## 2021-08-03 ENCOUNTER — Encounter: Payer: Self-pay | Admitting: Family Medicine

## 2021-08-03 DIAGNOSIS — F4323 Adjustment disorder with mixed anxiety and depressed mood: Secondary | ICD-10-CM

## 2021-08-04 MED ORDER — ALPRAZOLAM 0.5 MG PO TABS: 0.5000 mg | ORAL_TABLET | Freq: Two times a day (BID) | ORAL | 0 refills | Status: DC | PRN

## 2021-08-07 ENCOUNTER — Other Ambulatory Visit: Payer: Self-pay | Admitting: Family Medicine

## 2021-08-07 DIAGNOSIS — F5101 Primary insomnia: Secondary | ICD-10-CM

## 2021-10-16 ENCOUNTER — Encounter: Payer: Self-pay | Admitting: Family Medicine

## 2021-10-16 MED ORDER — GABAPENTIN 300 MG PO CAPS: 300.0000 mg | ORAL_CAPSULE | Freq: Every day | ORAL | 3 refills | Status: DC

## 2021-11-30 ENCOUNTER — Other Ambulatory Visit (HOSPITAL_COMMUNITY)
Admission: RE | Admit: 2021-11-30 | Discharge: 2021-11-30 | Disposition: A | Discharge status: Home / Self Care | Payer: BC Managed Care – PPO | Source: Ambulatory Visit | Attending: Obstetrics and Gynecology | Admitting: Obstetrics and Gynecology

## 2021-11-30 ENCOUNTER — Encounter: Payer: Self-pay | Admitting: Obstetrics and Gynecology

## 2021-11-30 ENCOUNTER — Ambulatory Visit (INDEPENDENT_AMBULATORY_CARE_PROVIDER_SITE_OTHER): Payer: BC Managed Care – PPO | Admitting: Obstetrics and Gynecology

## 2021-11-30 ENCOUNTER — Other Ambulatory Visit: Payer: Self-pay

## 2021-11-30 VITALS — BP 150/82 | HR 78 | Ht 62.75 in | Wt 189.0 lb

## 2021-11-30 DIAGNOSIS — N950 Postmenopausal bleeding: Secondary | ICD-10-CM

## 2021-11-30 DIAGNOSIS — Z124 Encounter for screening for malignant neoplasm of cervix: Secondary | ICD-10-CM

## 2021-11-30 DIAGNOSIS — N858 Other specified noninflammatory disorders of uterus: Secondary | ICD-10-CM | POA: Diagnosis not present

## 2021-11-30 NOTE — Progress Notes (Signed)
GYNECOLOGY  VISIT   HPI: 61 y.o.   Married White or Caucasian Not Hispanic or Latino  female   [phone] with Patient's last menstrual period was 01/06/2014 (approximate).   here for spotting and cramping postmenopause. She states that it started yesterday morning. Feels like she is starting a cycle. She passes a small clot yesterday. Not sexually active.  Husband is on dialysis, not able to get another kidney. H/O heart surgery. Last year was very stressful.  They are currently living with her Daughter, building an apartment over her Todd.    12/03/18 pap negative, negative hpv.   GYNECOLOGIC HISTORY: Patient's last menstrual period was 01/06/2014 (approximate). Contraception:PMP  Menopausal hormone therapy: none         OB History     Gravida  2   Para  1   Term  1   Preterm  0   AB  1   Living  1      SAB  1   IAB  0   Ectopic  0   Multiple  0   Live Births  1              Patient Active Problem List   Diagnosis Date Noted   DJD (degenerative joint disease), lumbar 06/06/2020   Mixed hyperlipidemia 09/28/2019   Essential hypertension 09/28/2019   Sinus tachycardia 07/24/2019   Insulin resistance 05/25/2019   Situational mixed anxiety and depressive disorder 02/16/2019   Obesity (BMI 30-39.9) 08/20/2018   Primary insomnia 08/20/2018   OA (osteoarthritis) of right knee, s/p arthroscopy    Seasonal allergies     Past Medical History:  Diagnosis Date   Allergy    environmental and seasonal   DJD (degenerative joint disease), lumbar 06/06/2020   X-rays August 2021   History of chicken pox    HLD (hyperlipidemia)    HSV-1 infection    Hypertension    Insulin resistance    OA (osteoarthritis) of right knee, s/p arthroscopy    Seasonal allergies    Skin cancer     Past Surgical History:  Procedure Laterality Date   BREAST CYST EXCISION Left 10+ yrs ago   CESAREAN SECTION  08/27/86   DILATION AND CURETTAGE OF UTERUS  1997   Miscarriage    KNEE ARTHROSCOPY Right 02/2015   TONSILLECTOMY  1967    Current Outpatient Medications  Medication Sig Dispense Refill   ALPRAZolam (XANAX) 0.5 MG tablet Take 1 tablet (0.5 mg total) by mouth 2 (two) times daily as needed for anxiety. 30 tablet 0   Biotin 10000 MCG TABS Take by mouth.     cholecalciferol (VITAMIN D) 1000 units tablet Take 1,000 Units by mouth daily.     Cyanocobalamin (VITAMIN B 12 PO) Take by mouth.     gabapentin (NEURONTIN) 300 MG capsule Take 1 capsule (300 mg total) by mouth at bedtime. 90 capsule 3   metoprolol succinate (TOPROL-XL) 25 MG 24 hr tablet Take 1 tablet (25 mg total) by mouth daily. 90 tablet 3   traMADol (ULTRAM) 50 MG tablet Take 1 tablet (50 mg total) by mouth every 6 (six) hours as needed for moderate pain. 30 tablet 0   traZODone (DESYREL) 100 MG tablet Take 1 tablet (100 mg total) by mouth at bedtime. 90 tablet 3   No current facility-administered medications for this visit.     ALLERGIES: Patient has no known allergies.  Family History  Problem Relation Age of Onset   Breast cancer Mother  60       lumpectomy and radiation; estrogen fed   Hypertension Mother    Colon polyps Father    Heart disease Father    Diabetes type I Sister 73   High Cholesterol Sister    Lung cancer Maternal Aunt    Cancer Paternal Uncle    Colon cancer Neg Hx    Esophageal cancer Neg Hx    Stomach cancer Neg Hx    Rectal cancer Neg Hx     Social History   Socioeconomic History   Marital status: Married    Spouse name: Not on file   Number of children: 1   Years of education: Not on file   Highest education level: Not on file  Occupational History   Occupation: Company secretary: [employer]  Tobacco Use   Smoking status: Never    Passive exposure: Past (as a child)   Smokeless tobacco: Never  Vaping Use   Vaping Use: Never used  Substance and Sexual Activity   Alcohol use: Yes    Alcohol/week: 2.0 - 3.0 standard  drinks    Types: 2 - 3 Standard drinks or equivalent per week   Drug use: No   Sexual activity: Yes    Partners: Male    Birth control/protection: Post-menopausal  Other Topics Concern   Not on file  Social History Narrative   Husband with failing kidney and not candidate for transplant.   Social Determinants of Health   Financial Resource Strain: Not on file  Food Insecurity: Not on file  Transportation Needs: Not on file  Physical Activity: Not on file  Stress: Not on file  Social Connections: Not on file  Intimate Partner Violence: Not on file    Review of Systems  All other systems reviewed and are negative.  PHYSICAL EXAMINATION:    BP (!) 150/82    Pulse 78    Ht 5' 2.75" (1.594 m)    Wt 189 lb (85.7 kg)    LMP 01/06/2014 (Approximate)    SpO2 100%    BMI 33.75 kg/m     General appearance: alert, cooperative and appears stated age  Pelvic: External genitalia:  no lesions              Urethra:  normal appearing urethra with no masses, tenderness or lesions              Bartholins and Skenes: normal                 Vagina: normal appearing vagina with normal color and discharge, no lesions              Cervix: no lesions              Bimanual Exam:  Uterus:   retroverted, mobile, not tender, top normal sized              Adnexa: no mass, fullness, tenderness               The risks of endometrial biopsy were reviewed and a consent was obtained.  A speculum was placed in the vagina and the cervix was cleansed with betadine. A tenaculum was placed on the cervix and the uterine evacuator was placed into the endometrial cavity. The uterus sounded to ~9 cm. The endometrial biopsy was performed, taking care to get a representative sample, sampling 360 degrees of the uterine cavity. A moderate amount of tissue and  clot was obtained, 2 passes were needed. The tenaculum and speculum were removed. There were no complications.    Chaperone was present for exam.   1.  Postmenopausal bleeding - Surgical pathology( [place]/ [facility]) - Cytology - PAP  2. Screening for cervical cancer - Cytology - PAP

## 2021-11-30 NOTE — Patient Instructions (Signed)

## 2021-12-01 LAB — SURGICAL PATHOLOGY

## 2021-12-03 ENCOUNTER — Other Ambulatory Visit: Payer: Self-pay | Admitting: Obstetrics and Gynecology

## 2021-12-03 DIAGNOSIS — N950 Postmenopausal bleeding: Secondary | ICD-10-CM

## 2021-12-05 ENCOUNTER — Other Ambulatory Visit: Payer: Self-pay

## 2021-12-05 ENCOUNTER — Telehealth: Payer: Self-pay | Admitting: Obstetrics and Gynecology

## 2021-12-05 ENCOUNTER — Ambulatory Visit (INDEPENDENT_AMBULATORY_CARE_PROVIDER_SITE_OTHER): Payer: BC Managed Care – PPO

## 2021-12-05 ENCOUNTER — Ambulatory Visit (INDEPENDENT_AMBULATORY_CARE_PROVIDER_SITE_OTHER): Payer: BC Managed Care – PPO | Admitting: Obstetrics and Gynecology

## 2021-12-05 VITALS — BP 122/68 | HR 64 | Ht 62.75 in | Wt 189.0 lb

## 2021-12-05 DIAGNOSIS — N950 Postmenopausal bleeding: Secondary | ICD-10-CM | POA: Diagnosis not present

## 2021-12-05 NOTE — Progress Notes (Signed)
GYNECOLOGY  VISIT   HPI: 61 y.o.   Married White or Caucasian Not Hispanic or Latino  female   [phone] with Patient's last menstrual period was 01/06/2014 (approximate).   here for ultrasound consult for post menopausal bleeding.   11/30/21: A. ENDOMETRIUM, BIOPSY:  - Endometrium with stromal breakdown.  No hyperplasia or malignancy.   Pap is pending  GYNECOLOGIC HISTORY: Patient's last menstrual period was 01/06/2014 (approximate). Contraception:PMP Menopausal hormone therapy: none         OB History     Gravida  2   Para  1   Term  1   Preterm  0   AB  1   Living  1      SAB  1   IAB  0   Ectopic  0   Multiple  0   Live Births  1              Patient Active Problem List   Diagnosis Date Noted   DJD (degenerative joint disease), lumbar 06/06/2020   Mixed hyperlipidemia 09/28/2019   Essential hypertension 09/28/2019   Sinus tachycardia 07/24/2019   Insulin resistance 05/25/2019   Situational mixed anxiety and depressive disorder 02/16/2019   Obesity (BMI 30-39.9) 08/20/2018   Primary insomnia 08/20/2018   OA (osteoarthritis) of right knee, s/p arthroscopy    Seasonal allergies     Past Medical History:  Diagnosis Date   Allergy    environmental and seasonal   DJD (degenerative joint disease), lumbar 06/06/2020   X-rays August 2021   History of chicken pox    HLD (hyperlipidemia)    HSV-1 infection    Hypertension    Insulin resistance    OA (osteoarthritis) of right knee, s/p arthroscopy    Seasonal allergies    Skin cancer     Past Surgical History:  Procedure Laterality Date   BREAST CYST EXCISION Left 10+ yrs ago   CESAREAN SECTION  08/27/86   DILATION AND CURETTAGE OF UTERUS  1997   Miscarriage   KNEE ARTHROSCOPY Right 02/2015   TONSILLECTOMY  1967    Current Outpatient Medications  Medication Sig Dispense Refill   ALPRAZolam (XANAX) 0.5 MG tablet Take 1 tablet (0.5 mg total) by mouth 2 (two) times daily as needed for  anxiety. 30 tablet 0   Biotin 10000 MCG TABS Take by mouth.     cholecalciferol (VITAMIN D) 1000 units tablet Take 1,000 Units by mouth daily.     Cyanocobalamin (VITAMIN B 12 PO) Take by mouth.     gabapentin (NEURONTIN) 300 MG capsule Take 1 capsule (300 mg total) by mouth at bedtime. 90 capsule 3   metoprolol succinate (TOPROL-XL) 25 MG 24 hr tablet Take 1 tablet (25 mg total) by mouth daily. 90 tablet 3   traMADol (ULTRAM) 50 MG tablet Take 1 tablet (50 mg total) by mouth every 6 (six) hours as needed for moderate pain. 30 tablet 0   traZODone (DESYREL) 100 MG tablet Take 1 tablet (100 mg total) by mouth at bedtime. 90 tablet 3   No current facility-administered medications for this visit.     ALLERGIES: Patient has no known allergies.  Family History  Problem Relation Age of Onset   Breast cancer Mother 42       lumpectomy and radiation; estrogen fed   Hypertension Mother    Colon polyps Father    Heart disease Father    Diabetes type I Sister 42   High Cholesterol Sister  Lung cancer Maternal Aunt    Cancer Paternal Uncle    Colon cancer Neg Hx    Esophageal cancer Neg Hx    Stomach cancer Neg Hx    Rectal cancer Neg Hx     Social History   Socioeconomic History   Marital status: Married    Spouse name: Not on file   Number of children: 1   Years of education: Not on file   Highest education level: Not on file  Occupational History   Occupation: Company secretary: [employer]  Tobacco Use   Smoking status: Never    Passive exposure: Past (as a child)   Smokeless tobacco: Never  Vaping Use   Vaping Use: Never used  Substance and Sexual Activity   Alcohol use: Yes    Alcohol/week: 2.0 - 3.0 standard drinks    Types: 2 - 3 Standard drinks or equivalent per week   Drug use: No   Sexual activity: Yes    Partners: Male    Birth control/protection: Post-menopausal  Other Topics Concern   Not on file  Social History Narrative    Husband with failing kidney and not candidate for transplant.   Social Determinants of Health   Financial Resource Strain: Not on file  Food Insecurity: Not on file  Transportation Needs: Not on file  Physical Activity: Not on file  Stress: Not on file  Social Connections: Not on file  Intimate Partner Violence: Not on file    Review of Systems  All other systems reviewed and are negative.  PHYSICAL EXAMINATION:    BP 122/68    Pulse 64    Ht 5' 2.75" (1.594 m)    Wt 189 lb (85.7 kg)    LMP 01/06/2014 (Approximate)    SpO2 99%    BMI 33.75 kg/m     General appearance: alert, cooperative and appears stated age Neck: no adenopathy, supple, symmetrical, trachea midline and thyroid normal to inspection and palpation Heart: regular rate and rhythm Lungs: CTAB Abdomen: soft, non-tender; bowel sounds normal; no masses,  no organomegaly Extremities: normal, atraumatic, no cyanosis Skin: normal color, texture and turgor, no rashes or lesions Lymph: normal cervical supraclavicular and inguinal nodes Neurologic: grossly normal  Pelvic ultrasound  Indications: postmenopausal bleeding  Findings:  Uterus 7.3 x 4.62 x 5.95 cm, retroverted Fibroids: 1) 1.15 x 1.19 cm, LUS 2) 0.4 x 1.09 cm, intramural 3) 0.8 x 1.08 cm, intramural  Endometrium 14.86 mm, cystic, + vascularity Density seen, possible polyp, 1.52 x 1.0 cm  Cervix: suspected endocervical polyp, 1.33 x 1.38 cm, vascular  Left ovary 2.17 x 1.02 x 1.25 cm  Right ovary 1.92 x 1.15 x 1.09 cm  No free fluid  Impression:  Normal sized, retroverted uterus Multiple, small, intramural myomas Suspected cervical polyp Thickened, cystic endometrium, concerning for endometrial polyp Normal, atrophic appearing ovaries bilaterally  1. Postmenopausal bleeding Ultrasound concerning for endometrial polyp Plan: hysteroscopy, polypectomy, dilation and curettage. Reviewed risks, including: bleeding, infection, uterine perforation,  fluid overload, need for further sugery

## 2021-12-05 NOTE — Telephone Encounter (Signed)
Surgery: CPT [phone] - Hysteroscopy/D&C/Myosure,   Diagnosis: N95.0 Postmenopausal Bleeding, R93.89 Thickened Endometrium,   Location: [facility]  Status: Outpatient  Time: 54 Minutes  Assistant: N/A  Urgency: At [employer], she is really worried, so as soon as possible. Thanks  Pre-Op Appointment: Completed  Post-Op Appointment(s): 1 Week,   Time Out Of Work: Day Of Surgery ONLY

## 2021-12-06 LAB — CYTOLOGY - PAP
Diagnosis: NEGATIVE
Diagnosis: REACTIVE

## 2021-12-06 NOTE — Telephone Encounter (Signed)
Spoke with patient. Reviewed surgery dates. Patient request to proceed with surgery on 12/26/21.  Advised patient I will forward to business office for return call. I will return call once surgery date and time confirmed. Patient verbalizes understanding and is agreeable.  ? ?Surgery request sent.  ? ?

## 2021-12-06 NOTE — Telephone Encounter (Signed)
Call to patient to review surgery benefits. Patient stated that she was eating lunch and would call me back. ?

## 2021-12-07 NOTE — Telephone Encounter (Signed)
Spoke with patient regarding surgery benefits. Patient acknowledges understanding of information presented. Patient is aware that benefits presented are professional benefits only. Patient is aware the hospital will call with facility benefits. See account note.  Routing to Jill Hamm, RN.  

## 2021-12-07 NOTE — Telephone Encounter (Signed)
Spoke with patient. Surgery date request confirmed.  ?Advised surgery is scheduled for 12/26/21, [facility] at 0930. ?Surgery instruction sheet and hospital brochure reviewed, printed copy will be mailed. Patient advised if Covid screening and quarantine requirements and agreeable.  ? ?Routing to provider. Encounter closed.  ? ?

## 2021-12-08 ENCOUNTER — Encounter: Payer: Self-pay | Admitting: Obstetrics and Gynecology

## 2021-12-19 ENCOUNTER — Other Ambulatory Visit (HOSPITAL_COMMUNITY): Payer: BC Managed Care – PPO

## 2021-12-21 ENCOUNTER — Other Ambulatory Visit: Payer: Self-pay

## 2021-12-21 ENCOUNTER — Encounter (HOSPITAL_BASED_OUTPATIENT_CLINIC_OR_DEPARTMENT_OTHER): Payer: Self-pay | Admitting: Obstetrics and Gynecology

## 2021-12-21 DIAGNOSIS — R000 Tachycardia, unspecified: Secondary | ICD-10-CM

## 2021-12-21 DIAGNOSIS — K002 Abnormalities of size and form of teeth: Secondary | ICD-10-CM

## 2021-12-21 HISTORY — DX: Tachycardia, unspecified: R00.0

## 2021-12-21 HISTORY — DX: Abnormalities of size and form of teeth: K00.2

## 2021-12-21 NOTE — Progress Notes (Addendum)
Spoke w/ via phone for pre-op interview---pt ?Lab needs dos----   none            ?Lab results------lab appt 12-22-2021 945 for cbc cmp  ?COVID test -----patient states asymptomatic no test needed ?Arrive at -------740 am 12-26-2021 ?NPO after MN NO Solid Food.  Clear liquids from MN until---640 am ?Med rec completed ?Medications to take morning of surgery -----metoprolol, xanax prn, tramadol prn, certrizine ?Diabetic medication -----n/a ?Patient instructed no nail polish to be worn day of surgery ?Patient instructed to bring photo id and insurance card day of surgery ?Patient aware to have Driver (ride ) / caregiver     husband tommy will stay for 24 hours after surgery  ?Patient Special Instructions -----none ?Pre-Op special Istructions -----none ?Patient verbalized understanding of instructions that were given at this phone interview. ?Patient denies shortness of breath, chest pain, fever, cough at this phone interview.  ? ?Pt called to review surgery instructions ?

## 2021-12-21 NOTE — Progress Notes (Signed)
Sent secure chat to dr Talbert Nan and jill hamm RN called patient x 3 days voice mail full unable to leave message. ?

## 2021-12-22 ENCOUNTER — Encounter (HOSPITAL_COMMUNITY)
Admission: RE | Admit: 2021-12-22 | Discharge: 2021-12-22 | Disposition: A | Discharge status: Home / Self Care | Payer: BC Managed Care – PPO | Source: Ambulatory Visit | Attending: Obstetrics and Gynecology | Admitting: Obstetrics and Gynecology

## 2021-12-22 ENCOUNTER — Other Ambulatory Visit: Payer: Self-pay

## 2021-12-22 DIAGNOSIS — Z01818 Encounter for other preprocedural examination: Secondary | ICD-10-CM | POA: Diagnosis not present

## 2021-12-22 LAB — COMPREHENSIVE METABOLIC PANEL
ALT: 17 U/L (ref 0–44)
AST: 21 U/L (ref 15–41)
Albumin: 4.1 g/dL (ref 3.5–5.0)
Alkaline Phosphatase: 99 U/L (ref 38–126)
Anion gap: 6 (ref 5–15)
BUN: 15 mg/dL (ref 6–20)
CO2: 25 mmol/L (ref 22–32)
Calcium: 9 mg/dL (ref 8.9–10.3)
Chloride: 103 mmol/L (ref 98–111)
Creatinine, Ser: 0.95 mg/dL (ref 0.44–1.00)
GFR, Estimated: >60 mL/min (ref >60)
Glucose, Bld: 101 mg/dL — ABNORMAL HIGH (ref 70–99)
Potassium: 3.9 mmol/L (ref 3.5–5.1)
Sodium: 134 mmol/L — ABNORMAL LOW (ref 135–145)
Total Bilirubin: 0.4 mg/dL (ref 0.3–1.2)
Total Protein: 7.2 g/dL (ref 6.5–8.1)

## 2021-12-22 LAB — CBC
HCT: 41.3 % (ref 36.0–46.0)
Hemoglobin: 13.5 g/dL (ref 12.0–15.0)
MCH: 30.7 pg (ref 26.0–34.0)
MCHC: 32.7 g/dL (ref 30.0–36.0)
MCV: 93.9 fL (ref 80.0–100.0)
Platelets: 289 10*3/uL (ref 150–400)
RBC: 4.4 MIL/uL (ref 3.87–5.11)
RDW: 12.8 % (ref 11.5–15.5)
WBC: 7.6 10*3/uL (ref 4.0–10.5)
nRBC: 0 % (ref 0.0–0.2)

## 2021-12-26 ENCOUNTER — Encounter (HOSPITAL_BASED_OUTPATIENT_CLINIC_OR_DEPARTMENT_OTHER)
Admission: RE | Disposition: A | Discharge status: Home / Self Care | Payer: Self-pay | Source: Home / Self Care | Attending: Obstetrics and Gynecology

## 2021-12-26 ENCOUNTER — Other Ambulatory Visit: Payer: Self-pay

## 2021-12-26 ENCOUNTER — Ambulatory Visit (HOSPITAL_BASED_OUTPATIENT_CLINIC_OR_DEPARTMENT_OTHER): Payer: BC Managed Care – PPO | Admitting: Anesthesiology

## 2021-12-26 ENCOUNTER — Ambulatory Visit (HOSPITAL_BASED_OUTPATIENT_CLINIC_OR_DEPARTMENT_OTHER)
Admission: RE | Admit: 2021-12-26 | Discharge: 2021-12-26 | Disposition: A | Discharge status: Home / Self Care | Payer: BC Managed Care – PPO | Attending: Obstetrics and Gynecology | Admitting: Obstetrics and Gynecology

## 2021-12-26 ENCOUNTER — Encounter (HOSPITAL_BASED_OUTPATIENT_CLINIC_OR_DEPARTMENT_OTHER): Payer: Self-pay | Admitting: Obstetrics and Gynecology

## 2021-12-26 DIAGNOSIS — N950 Postmenopausal bleeding: Secondary | ICD-10-CM | POA: Diagnosis not present

## 2021-12-26 DIAGNOSIS — N858 Other specified noninflammatory disorders of uterus: Secondary | ICD-10-CM | POA: Diagnosis not present

## 2021-12-26 DIAGNOSIS — F418 Other specified anxiety disorders: Secondary | ICD-10-CM | POA: Diagnosis not present

## 2021-12-26 DIAGNOSIS — N8502 Endometrial intraepithelial neoplasia [EIN]: Secondary | ICD-10-CM | POA: Diagnosis not present

## 2021-12-26 DIAGNOSIS — N840 Polyp of corpus uteri: Secondary | ICD-10-CM | POA: Diagnosis not present

## 2021-12-26 DIAGNOSIS — I10 Essential (primary) hypertension: Secondary | ICD-10-CM | POA: Diagnosis not present

## 2021-12-26 DIAGNOSIS — R9389 Abnormal findings on diagnostic imaging of other specified body structures: Secondary | ICD-10-CM | POA: Diagnosis not present

## 2021-12-26 DIAGNOSIS — Z01818 Encounter for other preprocedural examination: Secondary | ICD-10-CM

## 2021-12-26 HISTORY — DX: Postmenopausal bleeding: N95.0

## 2021-12-26 HISTORY — PX: DILATATION & CURETTAGE/HYSTEROSCOPY WITH MYOSURE: SHX6511

## 2021-12-26 HISTORY — DX: Anxiety disorder, unspecified: F41.9

## 2021-12-26 HISTORY — DX: Presence of spectacles and contact lenses: Z97.3

## 2021-12-26 SURGERY — DILATATION & CURETTAGE/HYSTEROSCOPY WITH MYOSURE
Anesthesia: General | Site: Vagina

## 2021-12-26 MED ORDER — ONDANSETRON HCL 4 MG/2ML IJ SOLN
INTRAMUSCULAR | Status: DC | PRN
  Administered 2021-12-26: 4 mg via INTRAVENOUS

## 2021-12-26 MED ORDER — MIDAZOLAM HCL 2 MG/2ML IJ SOLN
INTRAMUSCULAR | Status: AC
  Filled 2021-12-26: qty 2

## 2021-12-26 MED ORDER — KETOROLAC TROMETHAMINE 30 MG/ML IJ SOLN: 30.0000 mg | Freq: Once | INTRAMUSCULAR | Status: DC | PRN

## 2021-12-26 MED ORDER — SODIUM CHLORIDE 0.9 % IR SOLN
Status: DC | PRN
  Administered 2021-12-26: 3000 mL

## 2021-12-26 MED ORDER — PROPOFOL 10 MG/ML IV BOLUS
INTRAVENOUS | Status: DC | PRN
  Administered 2021-12-26: 130 mg via INTRAVENOUS

## 2021-12-26 MED ORDER — LACTATED RINGERS IV SOLN: INTRAVENOUS | Status: DC

## 2021-12-26 MED ORDER — MIDAZOLAM HCL 5 MG/5ML IJ SOLN
INTRAMUSCULAR | Status: DC | PRN
  Administered 2021-12-26: 2 mg via INTRAVENOUS

## 2021-12-26 MED ORDER — FENTANYL CITRATE (PF) 100 MCG/2ML IJ SOLN
INTRAMUSCULAR | Status: AC
  Filled 2021-12-26: qty 2

## 2021-12-26 MED ORDER — DEXAMETHASONE SODIUM PHOSPHATE 10 MG/ML IJ SOLN
INTRAMUSCULAR | Status: AC
  Filled 2021-12-26: qty 1

## 2021-12-26 MED ORDER — KETOROLAC TROMETHAMINE 30 MG/ML IJ SOLN
INTRAMUSCULAR | Status: AC
  Filled 2021-12-26: qty 1

## 2021-12-26 MED ORDER — FENTANYL CITRATE (PF) 100 MCG/2ML IJ SOLN
INTRAMUSCULAR | Status: DC | PRN
  Administered 2021-12-26 (×2): 50 ug via INTRAVENOUS

## 2021-12-26 MED ORDER — HYDROMORPHONE HCL 1 MG/ML IJ SOLN: 0.2500 mg | INTRAMUSCULAR | Status: DC | PRN

## 2021-12-26 MED ORDER — ONDANSETRON HCL 4 MG/2ML IJ SOLN: 4.0000 mg | Freq: Once | INTRAMUSCULAR | Status: DC | PRN

## 2021-12-26 MED ORDER — DEXAMETHASONE SODIUM PHOSPHATE 4 MG/ML IJ SOLN
INTRAMUSCULAR | Status: DC | PRN
  Administered 2021-12-26: 5 mg via INTRAVENOUS

## 2021-12-26 MED ORDER — KETOROLAC TROMETHAMINE 30 MG/ML IJ SOLN
INTRAMUSCULAR | Status: DC | PRN
  Administered 2021-12-26: 30 mg via INTRAVENOUS

## 2021-12-26 MED ORDER — ONDANSETRON HCL 4 MG/2ML IJ SOLN
INTRAMUSCULAR | Status: AC
  Filled 2021-12-26: qty 2

## 2021-12-26 MED ORDER — OXYCODONE HCL 5 MG PO TABS: 5.0000 mg | ORAL_TABLET | Freq: Once | ORAL | Status: DC | PRN

## 2021-12-26 MED ORDER — OXYCODONE HCL 5 MG/5ML PO SOLN: 5.0000 mg | Freq: Once | ORAL | Status: DC | PRN

## 2021-12-26 MED ORDER — PHENYLEPHRINE HCL (PRESSORS) 10 MG/ML IV SOLN
INTRAVENOUS | Status: DC | PRN
  Administered 2021-12-26: 80 ug via INTRAVENOUS

## 2021-12-26 MED ORDER — ACETAMINOPHEN 500 MG PO TABS
1000.0000 mg | ORAL_TABLET | Freq: Once | ORAL | Status: AC
  Administered 2021-12-26: 1000 mg via ORAL

## 2021-12-26 MED ORDER — AMISULPRIDE (ANTIEMETIC) 5 MG/2ML IV SOLN: 10.0000 mg | Freq: Once | INTRAVENOUS | Status: DC | PRN

## 2021-12-26 MED ORDER — ACETAMINOPHEN 500 MG PO TABS
ORAL_TABLET | ORAL | Status: AC
  Filled 2021-12-26: qty 2

## 2021-12-26 MED ORDER — LIDOCAINE HCL (CARDIAC) PF 100 MG/5ML IV SOSY
PREFILLED_SYRINGE | INTRAVENOUS | Status: DC | PRN
  Administered 2021-12-26: 60 mg via INTRAVENOUS

## 2021-12-26 MED ORDER — PROPOFOL 10 MG/ML IV BOLUS
INTRAVENOUS | Status: AC
  Filled 2021-12-26: qty 20

## 2021-12-26 SURGICAL SUPPLY — 20 items
DEVICE MYOSURE REACH (MISCELLANEOUS) ×1 IMPLANT
DRSG TELFA 3X8 NADH (GAUZE/BANDAGES/DRESSINGS) ×2 IMPLANT
GAUZE 4X4 16PLY ~~LOC~~+RFID DBL (SPONGE) ×5 IMPLANT
GLOVE SURG ENC MOIS LTX SZ6.5 (GLOVE) ×3 IMPLANT
GLOVE SURG LTX SZ6.5 (GLOVE) ×1 IMPLANT
GLOVE SURG POLYISO LF SZ6.5 (GLOVE) ×1 IMPLANT
GLOVE SURG UNDER POLY LF SZ6.5 (GLOVE) ×1 IMPLANT
GLOVE SURG UNDER POLY LF SZ7 (GLOVE) ×1 IMPLANT
GLOVE SURG UNDER POLY LF SZ7.5 (GLOVE) ×1 IMPLANT
GOWN STRL REUS W/ TWL LRG LVL3 (GOWN DISPOSABLE) IMPLANT
GOWN STRL REUS W/TWL LRG LVL3 (GOWN DISPOSABLE) ×6 IMPLANT
IV NS IRRIG 3000ML ARTHROMATIC (IV SOLUTION) ×3 IMPLANT
KIT PROCEDURE FLUENT (KITS) ×3 IMPLANT
KIT TURNOVER CYSTO (KITS) ×3 IMPLANT
PACK VAGINAL MINOR WOMEN LF (CUSTOM PROCEDURE TRAY) ×3 IMPLANT
PAD DRESSING TELFA 3X8 NADH (GAUZE/BANDAGES/DRESSINGS) ×2 IMPLANT
PAD OB MATERNITY 4.3X12.25 (PERSONAL CARE ITEMS) ×3 IMPLANT
PAD PREP 24X48 CUFFED NSTRL (MISCELLANEOUS) ×3 IMPLANT
SEAL ROD LENS SCOPE MYOSURE (ABLATOR) ×3 IMPLANT
TOWEL OR 17X26 10 PK STRL BLUE (TOWEL DISPOSABLE) ×3 IMPLANT

## 2021-12-26 NOTE — Transfer of Care (Signed)
Immediate Anesthesia Transfer of Care Note ? ?Patient: Jeanne Parks ? ?Procedure(s) Performed: DILATATION & CURETTAGE/HYSTEROSCOPY WITH MYOSURE (Vagina ) ? ?Patient Location: PACU ? ?Anesthesia Type:General ? ?Level of Consciousness: awake, alert  and oriented ? ?Airway & Oxygen Therapy: Patient Spontanous Breathing and Patient connected to nasal cannula oxygen ? ?Post-op Assessment: Report given to RN and Post -op Vital signs reviewed and stable ? ?Post vital signs: Reviewed and stable ? ?Last Vitals:  ?Vitals Value Taken Time  ?BP    ?Temp    ?Pulse 61 12/26/21 0947  ?Resp 16 12/26/21 0947  ?SpO2 100 % 12/26/21 0947  ?Vitals shown include unvalidated device data. ? ?Last Pain:  ?Vitals:  ? 12/26/21 0804  ?TempSrc: Oral  ?PainSc: 0-No pain  ?   ? ?Patients Stated Pain Goal: 5 (12/26/21 0804) ? ?Complications: No notable events documented. ?

## 2021-12-26 NOTE — Anesthesia Postprocedure Evaluation (Signed)
Anesthesia Post Note ? ?Patient: Jeanne Parks ? ?Procedure(s) Performed: DILATATION & CURETTAGE/HYSTEROSCOPY WITH MYOSURE (Vagina ) ? ?  ? ?Patient location during evaluation: PACU ?Anesthesia Type: General ?Level of consciousness: awake and alert, oriented and patient cooperative ?Pain management: pain level controlled ?Vital Signs Assessment: post-procedure vital signs reviewed and stable ?Respiratory status: spontaneous breathing, nonlabored ventilation and respiratory function stable ?Cardiovascular status: blood pressure returned to baseline and stable ?Postop Assessment: no apparent nausea or vomiting ?Anesthetic complications: no ? ? ?No notable events documented. ? ?Last Vitals:  ?Vitals:  ? 12/26/21 1016 12/26/21 1044  ?BP: 119/68 112/87  ?Pulse: 67 63  ?Resp: 16 16  ?Temp:  36.4 ?C  ?SpO2: 100% 100%  ?  ?Last Pain:  ?Vitals:  ? 12/26/21 1044  ?TempSrc:   ?PainSc: 0-No pain  ? ? ?  ?  ?  ?  ?  ?  ? ?Jarome Matin Halston Fairclough ? ? ? ? ?

## 2021-12-26 NOTE — Anesthesia Procedure Notes (Signed)
Procedure Name: LMA Insertion ?Date/Time: 12/26/2021 9:48 AM ?Performed by: Bufford Spikes, CRNA ?Pre-anesthesia Checklist: Patient identified, Emergency Drugs available, Suction available and Patient being monitored ?Patient Re-evaluated:Patient Re-evaluated prior to induction ?Oxygen Delivery Method: Circle system utilized ?Preoxygenation: Pre-oxygenation with 100% oxygen ?Induction Type: IV induction ?Ventilation: Mask ventilation without difficulty ?LMA: LMA inserted ?LMA Size: 4.0 ?Number of attempts: 1 ?Placement Confirmation: positive ETCO2 ?Tube secured with: Tape ?Dental Injury: Teeth and Oropharynx as per pre-operative assessment  ? ? ? ? ?

## 2021-12-26 NOTE — Anesthesia Preprocedure Evaluation (Addendum)
Anesthesia Evaluation  ?Patient identified by MRN, date of birth, ID band ?Patient awake ? ? ? ?Reviewed: ?Allergy & Precautions, NPO status , Patient's Chart, lab work & pertinent test results, reviewed documented beta blocker date and time  ? ?Airway ?Mallampati: III ? ?TM Distance: >3 FB ?Neck ROM: Full ? ? ? Dental ? ?(+) Teeth Intact, Dental Advisory Given ?  ?Pulmonary ?neg pulmonary ROS,  ?  ?Pulmonary exam normal ?breath sounds clear to auscultation ? ? ? ? ? ? Cardiovascular ?hypertension (128/68 in preop), Pt. on medications and Pt. on home beta blockers ?Normal cardiovascular exam ?Rhythm:Regular Rate:Normal ? ? ?  ?Neuro/Psych ?PSYCHIATRIC DISORDERS Anxiety Depression negative neurological ROS ?   ? GI/Hepatic ?negative GI ROS, Neg liver ROS,   ?Endo/Other  ?BMI 34 ? Renal/GU ?negative Renal ROS  ?negative genitourinary ?  ?Musculoskeletal ? ?(+) Arthritis , Osteoarthritis,   ? Abdominal ?  ?Peds ? Hematology ?hct 41.3   ?Anesthesia Other Findings ? ? Reproductive/Obstetrics ?negative OB ROS ? ?  ? ? ? ? ? ? ? ? ? ? ? ? ? ?  ?  ? ? ? ? ? ? ?Anesthesia Physical ?Anesthesia Plan ? ?ASA: 2 ? ?Anesthesia Plan: General  ? ?Post-op Pain Management: Tylenol PO (pre-op)* and Toradol IV (intra-op)*  ? ?Induction: Intravenous ? ?PONV Risk Score and Plan: 4 or greater and Ondansetron, Dexamethasone, Midazolam and Treatment may vary due to age or medical condition ? ?Airway Management Planned: LMA ? ?Additional Equipment: None ? ?Intra-op Plan:  ? ?Post-operative Plan: Extubation in OR ? ?Informed Consent: I have reviewed the patients History and Physical, chart, labs and discussed the procedure including the risks, benefits and alternatives for the proposed anesthesia with the patient or authorized representative who has indicated his/her understanding and acceptance.  ? ? ? ?Dental advisory given ? ?Plan Discussed with: CRNA ? ?Anesthesia Plan Comments:   ? ? ? ? ? ?Anesthesia  Quick Evaluation ? ?

## 2021-12-26 NOTE — Op Note (Signed)
Preoperative Diagnosis: postmenopausal bleeding ? ?Postoperative Diagnosis: postmenopausal bleeding, endometrial polyps ? ?Procedure: Hysteroscopy, polypectomy, dilation and curettage ? ?Surgeon: Dr Sumner Boast ? ?Assistants: None ? ?Anesthesia: General via LMA ? ?EBL: 5 cc ? ?Fluids: 400 cc ? ?Fluid deficit: 100 cc ? ?Urine output: not recorded ? ?Indications for surgery: The patient is a 61 yo female, who presented with postmenopausal bleeding. Work up included a benign endometrial biopsy and an ultrasound that showed a thickened endocervical and endometrial canal (suspicious for endocervical and endometrial polyps).  ?The risks of the surgery were reviewed with the patient and the consent form was signed prior to her surgery. ? ?Findings: top normal sized retroverted uterus, no adnexal masses. Hysteroscopy revealed multiple endometrial polyps, one endometrial polyp was seen coming into the endocervical canal. After the polypectomy the cavity was empty, normal tubal ostia bilaterally.  ? ?Specimens: endometrial polyps, endometrial curettings ? ? ?Procedure: The patient was taken to the operating room with an IV in place. She was placed in the dorsal lithotomy position and anesthesia was administered. She was prepped and draped in the usual sterile fashion for a vaginal procedure. She voided on the way to the OR. A weighted speculum was placed in the vagina and a single tooth tenaculum was placed on the anterior lip of the cervix. The cervix was dilated to a #6 hagar dilator. The uterus was sounded to ~8 cm. The myosure hysteroscope was inserted into the uterine cavity. With continuous infusion of normal saline, the uterine cavity was visualized with the above findings. The myosure reach was used to resect the polyp. The myosure was then removed. The cavity was then curetted with the small sharp curette. The cavity had the characteristically gritty texture at the end of the procedure. The curette and the single  tooth tenaculum were removed. Oozing from the tenaculum site was stopped with pressure. The speculum was removed. The patients perineum was cleansed of betadine and she was taken out of the dorsal lithotomy position.  ?Upon awakening the LMA was removed and the patient was transferred to the recovery room in stable and awake condition.  ?The sponge and instrument count were correct. ? ? ?

## 2021-12-26 NOTE — Interval H&P Note (Signed)
History and Physical Interval Note: ? ?12/26/2021 ?8:47 AM ? ?Jeanne Parks  has presented today for surgery, with the diagnosis of Postmenopausal bleeding, thickened endometrium.  The various methods of treatment have been discussed with the patient and family. After consideration of risks, benefits and other options for treatment, the patient has consented to  Procedure(s): ?DILATATION & CURETTAGE/HYSTEROSCOPY WITH MYOSURE (N/A) as a surgical intervention.  The patient's history has been reviewed, patient examined, no change in status, stable for surgery.  I have reviewed the patient's chart and labs.  Questions were answered to the patient's satisfaction.   ? ? ?[name] ? ? ?

## 2021-12-26 NOTE — Discharge Instructions (Addendum)
?  DISCHARGE INSTRUCTIONS: HYSTEROSCOPY / ENDOMETRIAL ABLATION ?The following instructions have been prepared to help you care for yourself upon your return home. ? ?May Remove Scop patch on or before ? ?May take Ibuprofen after ? ?May take stool softner while taking narcotic pain medication to prevent constipation.  Drink plenty of water. ? ?Personal hygiene: ? Use sanitary pads for vaginal drainage, not tampons. ? Shower the day after your procedure. ? NO tub baths, pools or Jacuzzis for 2-3 weeks. ? Wipe front to back after using the bathroom. ? ?Activity and limitations: ? Do NOT drive or operate any equipment for 24 hours. The effects of anesthesia are still present ?and drowsiness may result. ? Do NOT rest in bed all day. ? Walking is encouraged. ? Walk up and down stairs slowly. ? You may resume your normal activity in one to two days or as indicated by your physician. ?Sexual activity: NO intercourse for at least 2 weeks after the procedure, or as indicated by your ?Doctor. ? ?Diet: Eat a light meal as desired this evening. You may resume your usual diet tomorrow. ? ?Return to Work: You may resume your work activities in one to two days or as indicated by your ?Doctor. ? ?What to expect after your surgery: Expect to have vaginal bleeding/discharge for 2-3 days and ?spotting for up to 10 days. It is not unusual to have soreness for up to 1-2 weeks. You may have a ?slight burning sensation when you urinate for the first day. Mild cramps may continue for a couple of ?days. You may have a regular period in 2-6 weeks. ? ?Call your doctor for any of the following: ? Excessive vaginal bleeding or clotting, saturating and changing one pad every hour. ? Inability to urinate 6 hours after discharge from hospital. ? Pain not relieved by pain medication. ? Fever of 100.4? F or greater. ? Unusual vaginal discharge or odor. ? ? ? ? ?Post Anesthesia Home Care Instructions ? ?Activity: ?Get plenty of rest for the remainder  of the day. A responsible individual must stay with you for 24 hours following the procedure.  ?For the next 24 hours, DO NOT: ?-Drive a car ?-Paediatric nurse ?-Drink alcoholic beverages ?-Take any medication unless instructed by your physician ?-Make any legal decisions or sign important papers. ? ?Meals: ?Start with liquid foods such as gelatin or soup. Progress to regular foods as tolerated. Avoid greasy, spicy, heavy foods. If nausea and/or vomiting occur, drink only clear liquids until the nausea and/or vomiting subsides. Call your physician if vomiting continues. ? ?Special Instructions/Symptoms: ?Your throat may feel dry or sore from the anesthesia or the breathing tube placed in your throat during surgery. If this causes discomfort, gargle with warm salt water. The discomfort should disappear within 24 hours. ? ? ?Do not take any nonsteroidal anti inflammatories until after 3:30 pm today. ? ? ? ?    ?

## 2021-12-27 ENCOUNTER — Encounter (HOSPITAL_BASED_OUTPATIENT_CLINIC_OR_DEPARTMENT_OTHER): Payer: Self-pay | Admitting: Obstetrics and Gynecology

## 2021-12-27 LAB — SURGICAL PATHOLOGY

## 2021-12-28 ENCOUNTER — Telehealth: Payer: Self-pay | Admitting: Obstetrics and Gynecology

## 2021-12-28 NOTE — Telephone Encounter (Signed)
Surgery: CPT [phone] - Total Laparoscopic Hysterectomy with Salpingectomy and/or Oophorectomy,  cystoscopy ?Will need BSO ? ?Diagnosis: N95.0 Postmenopausal Bleeding,  endometrial hyperplasia ,  ? ?Location: [facility] ? ?Status: Outpatient with Overnight Bed ? ?Time: 90 Minutes ? ?Assistant: Josefa Half, MD, [place] ? ?Urgency: she would like to have surgery the week of 4/24-25 if possible ? ?Pre-Op Appointment: To Be Scheduled. She has a post op appointment scheduled next week. I could do the preop then if it is appropriate timing from when her surgery is scheduled. Please confirm with the patient.  ? ?Post-Op Appointment(s): 1 Week, 4 Weeks ? ?Time Out Of Work: 6 Weeks ? ?

## 2021-12-28 NOTE — Telephone Encounter (Signed)
-----   Message from Lafonda Mosses, MD sent at 12/27/2021  2:54 PM EDT ----- ?Hi Terez Freimark, ?I think with focal areas of EIN with good sampling and visualization of the cavity, her risk of endometrial cancer on hyst specimen is lower than the 40% we quote patients. How high it is, I don't know. From what you removed, EIN may have just been associated with her polyps. ?I think its reasonable for you to do it as long as she understands the risk of finding cancer. If caner was found, there's still a good chance she wouldn't need additional surgery for lymph node sampling. ?Best, ?Kat ? ?----- Message ----- ?From: [name], MD ?Sent: 12/27/2021   2:53 PM EDT ?To: Lafonda Mosses, MD ? ?Hi Kat, ? ?This patient had PMP bleeding, a benign biopsy, thickened endometrial stripe.  ? ?She underwent a hysteroscopy, polypectomy, D&C yesterday with the following pathology report: ?FINAL MICROSCOPIC DIAGNOSIS:  ? ?A. 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  ?- No malignancy identified.  ? ?B. ENDOMETRIAL CURETTINGS:  ?- Inactive endometrium with polypoid fragment with cystic, crowded  ?glands.  ?- See comment.  ? ?COMMENT:  ?A. 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  ? ?B. There are inactive endometrial glands and a single polypoid fragment  ?with cystic, slightly dilated glands with tubal metaplasia. ?There is no  ?associated atypia.  ? ?Do you feel I should send her to you for hysterectomy, or are you okay with me doing it? ? ?Thanks, ?Sharee Pimple ? ? ?

## 2021-12-29 NOTE — Telephone Encounter (Signed)
Call to patient, no answer, mailbox full. ?

## 2022-01-01 NOTE — Telephone Encounter (Signed)
Spoke with patient. Reviewed surgery dates. Patient request to proceed with surgery on 01/29/22.  Advised patient I will forward to business office for return call. I will return call once surgery date and time confirmed. Patient verbalizes understanding and is agreeable.  ? ?Surgery request sent.  ? ?

## 2022-01-02 NOTE — Telephone Encounter (Signed)
Spoke with patient. Surgery date request confirmed.  ?Advised surgery is scheduled for 01/29/22, [name] at 0730.  ?Surgery instruction sheet and hospital brochure reviewed, printed copy will be mailed.  ?Patient verbalizes understanding and is agreeable.  ?Routing to [employer] for benefits.  ?May close encounter once benefits reviewed.  ? ?

## 2022-01-03 ENCOUNTER — Encounter: Payer: Self-pay | Admitting: Obstetrics and Gynecology

## 2022-01-03 ENCOUNTER — Telehealth: Payer: Self-pay | Admitting: Family Medicine

## 2022-01-03 ENCOUNTER — Ambulatory Visit (INDEPENDENT_AMBULATORY_CARE_PROVIDER_SITE_OTHER): Payer: BC Managed Care – PPO | Admitting: Obstetrics and Gynecology

## 2022-01-03 VITALS — BP 126/70 | HR 77 | Ht 62.75 in | Wt 190.0 lb

## 2022-01-03 DIAGNOSIS — I10 Essential (primary) hypertension: Secondary | ICD-10-CM

## 2022-01-03 DIAGNOSIS — N8500 Endometrial hyperplasia, unspecified: Secondary | ICD-10-CM | POA: Diagnosis not present

## 2022-01-03 DIAGNOSIS — R7303 Prediabetes: Secondary | ICD-10-CM | POA: Diagnosis not present

## 2022-01-03 MED ORDER — TRAMADOL HCL 50 MG PO TABS: 50.0000 mg | ORAL_TABLET | Freq: Four times a day (QID) | ORAL | 5 refills | Status: DC | PRN

## 2022-01-03 NOTE — Telephone Encounter (Signed)
Please advise for refill?  

## 2022-01-03 NOTE — Telephone Encounter (Signed)
Pt is asking for a refill of a medication from a former provider. Please advise ? ?MEDICATION:traMADol (ULTRAM) 50 MG tablet ? ? ?PHARMACY: ?[id number] [place], [place] - [address] Phone:  [phone] ?Fax:  [phone] ?  ? ?

## 2022-01-03 NOTE — H&P (View-Only) (Signed)
GYNECOLOGY  VISIT ?  ?HPI: ?61 y.o.   Married White or Caucasian Not Hispanic or Latino  female   ?[id number] with Patient's last menstrual period was 01/06/2014 (approximate).   ?here for one week post op follow D&C and preop for hysterectomy. She states that she has had some spotting.  ? ?Her pathology report from her hysteroscopy returned with: ?A. 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  ?- No malignancy identified.  ?  ?B. ENDOMETRIAL CURETTINGS:  ?- Inactive endometrium with polypoid fragment with cystic, crowded  ?glands.  ?- See comment.  ?  ?COMMENT:  ?A. 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  ?  ?B. There are inactive endometrial glands and a single polypoid fragment  ?with cystic, slightly dilated glands with tubal metaplasia.  There is no  ?associated atypia.  ? ?I contacted Dr Berline Lopes who felt it was reasonable for me to do her hysterectomy. ? ?Pap 11/30/21 negative.  ? ?No smoking, one drink a day ? ?GYNECOLOGIC HISTORY: ?Patient's last menstrual period was 01/06/2014 (approximate). ?Contraception:pmp  ?Menopausal hormone therapy: none  ?       ?OB History   ? ? Gravida  ?2  ? Para  ?1  ? Term  ?1  ? Preterm  ?0  ? AB  ?1  ? Living  ?1  ?  ? ? SAB  ?1  ? IAB  ?0  ? Ectopic  ?0  ? Multiple  ?0  ? Live Births  ?1  ?   ?  ?  ?    ? ?Patient Active Problem List  ? Diagnosis Date Noted  ? DJD (degenerative joint disease), lumbar 06/06/2020  ? Mixed hyperlipidemia 09/28/2019  ? Essential hypertension 09/28/2019  ? Sinus tachycardia 07/24/2019  ? Insulin resistance 05/25/2019  ? Situational mixed anxiety and depressive disorder 02/16/2019  ? Obesity (BMI 30-39.9) 08/20/2018  ? Primary insomnia 08/20/2018  ? OA (osteoarthritis) of right knee, s/p arthroscopy   ? Seasonal allergies   ? ? ?Past  Medical History:  ?Diagnosis Date  ? Allergy   ? environmental and seasonal  ? Anxiety   ? DJD (degenerative joint disease), lumbar 06/06/2020  ? X-rays August 2021  ? History of chicken pox   ? as child  ? HLD (hyperlipidemia)   ? HSV-1 infection   ? Hypertension   ? OA (osteoarthritis) of right knee, s/p arthroscopy   ? PMB (postmenopausal bleeding)   ? Seasonal allergies   ? Skin cancer   ? chest and back years ago basal cell carcinoma  ? Tachycardia 12/21/2021  ? managed by pcp  ? Tooth size and form abnormality 12/21/2021  ? 1 lower front tooth root dying per pt  ? Wears glasses for reading   ? ? ?Past Surgical History:  ?Procedure Laterality Date  ? BREAST CYST EXCISION Left 10+ yrs ago  ? CESAREAN SECTION  08/27/1986  ? colonscopy  2021  ? DILATATION & CURETTAGE/HYSTEROSCOPY WITH MYOSURE N/A 12/26/2021  ? Procedure: DILATATION & CURETTAGE/HYSTEROSCOPY WITH MYOSURE;  Surgeon: [name], MD;  [facility]: [facility];  Service: Gynecology;  Laterality: N/A;  ? [place] OF UTERUS  1997  ? Miscarriage  ? KNEE ARTHROSCOPY Right 02/2015  ? TONSILLECTOMY  1967  ? ? ?Current Outpatient Medications  ?Medication Sig Dispense Refill  ?  acetaminophen (TYLENOL) 500 MG tablet Take 1,000 mg by mouth every 6 (six) hours as needed.    ? ALPRAZolam (XANAX) 0.5 MG tablet Take 1 tablet (0.5 mg total) by mouth 2 (two) times daily as needed for anxiety. 30 tablet 0  ? Biotin 10000 MCG TABS Take by mouth.    ? cetirizine (ZYRTEC) 10 MG tablet Take 10 mg by mouth daily.    ? cholecalciferol (VITAMIN D) 1000 units tablet Take 1,000 Units by mouth daily.    ? Cyanocobalamin (VITAMIN B 12 PO) Take by mouth.    ? gabapentin (NEURONTIN) 300 MG capsule Take 1 capsule (300 mg total) by mouth at bedtime. 90 capsule 3  ? metoprolol succinate (TOPROL-XL) 25 MG 24 hr tablet Take 1 tablet (25 mg total) by mouth daily. 90 tablet 3  ? Phenylephrine-Acetaminophen (TYLENOL SINUS CONGESTION/PAIN PO) Take 2 tablets  by mouth 2 (two) times daily as needed.    ? traMADol (ULTRAM) 50 MG tablet Take 1 tablet (50 mg total) by mouth every 6 (six) hours as needed for moderate pain. 30 tablet 5  ? traZODone (DESYREL) 100 MG tablet Take 1 tablet (100 mg total) by mouth at bedtime. 90 tablet 3  ? ?No current facility-administered medications for this visit.  ? On toprol for tachycardia. ? ?ALLERGIES: Patient has no known allergies. ? ?Family History  ?Problem Relation Age of Onset  ? Breast cancer Mother 75  ?     lumpectomy and radiation; estrogen fed  ? Hypertension Mother   ? Colon polyps Father   ? Heart disease Father   ? Diabetes type I Sister 7  ? High Cholesterol Sister   ? Lung cancer Maternal Aunt   ? Cancer Paternal Uncle   ? Colon cancer Neg Hx   ? Esophageal cancer Neg Hx   ? Stomach cancer Neg Hx   ? Rectal cancer Neg Hx   ? ? ?Social History  ? ?Socioeconomic History  ? Marital status: Married  ?  Spouse name: Not on file  ? Number of children: 1  ? Years of education: Not on file  ? Highest education level: Not on file  ?Occupational History  ? Occupation: Administration  ?  Employer: [employer]  ?Tobacco Use  ? Smoking status: Never  ?  Passive exposure: Past (as a child)  ? Smokeless tobacco: Never  ?Vaping Use  ? Vaping Use: Never used  ?Substance and Sexual Activity  ? Alcohol use: Yes  ?  Alcohol/week: 2.0 - 3.0 standard drinks  ?  Types: 2 - 3 Standard drinks or equivalent per week  ?  Comment: 1 drink per day  ? Drug use: No  ? Sexual activity: Yes  ?  Partners: Male  ?  Birth control/protection: Post-menopausal  ?Other Topics Concern  ? Not on file  ?Social History Narrative  ? Husband with failing kidney and not candidate for transplant.  ? ?Social Determinants of Health  ? ?Financial Resource Strain: Not on file  ?Food Insecurity: Not on file  ?Transportation Needs: Not on file  ?Physical Activity: Not on file  ?Stress: Not on file  ?Social Connections: Not on file  ?Intimate Partner  Violence: Not on file  ? ? ?Review of Systems  ?All other systems reviewed and are negative. ? ?PHYSICAL EXAMINATION:   ? ?BP 126/70   Pulse 77   Ht 5' 2.75" (1.594 m)   Wt 190 lb (86.2 kg)   LMP 01/06/2014 (Approximate)  SpO2 99%   BMI 33.93 kg/m?     ?General appearance: alert, cooperative and appears stated age ?Neck: no adenopathy, supple, symmetrical, trachea midline and thyroid normal to inspection and palpation ?Heart: regular rate and rhythm ?Lungs: CTAB ?Abdomen: soft, non-tender; bowel sounds normal; no masses,  no organomegaly ?Extremities: normal, atraumatic, no cyanosis ?Skin: normal color, texture and turgor, no rashes or lesions ?Lymph: normal cervical supraclavicular and inguinal nodes ?Neurologic: grossly normal ?Pelvic: deferred ? ?1. Endometrial hyperplasia ?-Discussed total laparoscopic hysterectomy with BSO and cystoscopy. Reviewed the risks of the procedure, including infection, bleeding, damage to bowel/badder/vessels/ureters.  Discussed the possible need for laparotomy. Discussed post operative recovery and risk of cuff dehiscence. All of her questions were answered. ?-We discussed the small change of needing further surgery ? ?2. Essential hypertension ?Well controlled ? ?3. Prediabetes ?- Hemoglobin A1c ? ?

## 2022-01-03 NOTE — Progress Notes (Signed)
GYNECOLOGY  VISIT ?  ?HPI: ?61 y.o.   Married White or Caucasian Not Hispanic or Latino  female   ?[id number] with Patient's last menstrual period was 01/06/2014 (approximate).   ?here for one week post op follow D&C and preop for hysterectomy. She states that she has had some spotting.  ? ?Her pathology report from her hysteroscopy returned with: ?A. 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  ?- No malignancy identified.  ?  ?B. ENDOMETRIAL CURETTINGS:  ?- Inactive endometrium with polypoid fragment with cystic, crowded  ?glands.  ?- See comment.  ?  ?COMMENT:  ?A. 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  ?  ?B. There are inactive endometrial glands and a single polypoid fragment  ?with cystic, slightly dilated glands with tubal metaplasia.  There is no  ?associated atypia.  ? ?I contacted Dr Berline Lopes who felt it was reasonable for me to do her hysterectomy. ? ?Pap 11/30/21 negative.  ? ?No smoking, one drink a day ? ?GYNECOLOGIC HISTORY: ?Patient's last menstrual period was 01/06/2014 (approximate). ?Contraception:pmp  ?Menopausal hormone therapy: none  ?       ?OB History   ? ? Gravida  ?2  ? Para  ?1  ? Term  ?1  ? Preterm  ?0  ? AB  ?1  ? Living  ?1  ?  ? ? SAB  ?1  ? IAB  ?0  ? Ectopic  ?0  ? Multiple  ?0  ? Live Births  ?1  ?   ?  ?  ?    ? ?Patient Active Problem List  ? Diagnosis Date Noted  ? DJD (degenerative joint disease), lumbar 06/06/2020  ? Mixed hyperlipidemia 09/28/2019  ? Essential hypertension 09/28/2019  ? Sinus tachycardia 07/24/2019  ? Insulin resistance 05/25/2019  ? Situational mixed anxiety and depressive disorder 02/16/2019  ? Obesity (BMI 30-39.9) 08/20/2018  ? Primary insomnia 08/20/2018  ? OA (osteoarthritis) of right knee, s/p arthroscopy   ? Seasonal allergies   ? ? ?Past  Medical History:  ?Diagnosis Date  ? Allergy   ? environmental and seasonal  ? Anxiety   ? DJD (degenerative joint disease), lumbar 06/06/2020  ? X-rays August 2021  ? History of chicken pox   ? as child  ? HLD (hyperlipidemia)   ? HSV-1 infection   ? Hypertension   ? OA (osteoarthritis) of right knee, s/p arthroscopy   ? PMB (postmenopausal bleeding)   ? Seasonal allergies   ? Skin cancer   ? chest and back years ago basal cell carcinoma  ? Tachycardia 12/21/2021  ? managed by pcp  ? Tooth size and form abnormality 12/21/2021  ? 1 lower front tooth root dying per pt  ? Wears glasses for reading   ? ? ?Past Surgical History:  ?Procedure Laterality Date  ? BREAST CYST EXCISION Left 10+ yrs ago  ? CESAREAN SECTION  08/27/1986  ? colonscopy  2021  ? DILATATION & CURETTAGE/HYSTEROSCOPY WITH MYOSURE N/A 12/26/2021  ? Procedure: DILATATION & CURETTAGE/HYSTEROSCOPY WITH MYOSURE;  Surgeon: [name], MD;  Location: [facility];  Service: Gynecology;  Laterality: N/A;  ? [place] OF UTERUS  1997  ? Miscarriage  ? KNEE ARTHROSCOPY Right 02/2015  ? TONSILLECTOMY  1967  ? ? ?Current Outpatient Medications  ?Medication Sig Dispense Refill  ?  acetaminophen (TYLENOL) 500 MG tablet Take 1,000 mg by mouth every 6 (six) hours as needed.    ? ALPRAZolam (XANAX) 0.5 MG tablet Take 1 tablet (0.5 mg total) by mouth 2 (two) times daily as needed for anxiety. 30 tablet 0  ? Biotin 10000 MCG TABS Take by mouth.    ? cetirizine (ZYRTEC) 10 MG tablet Take 10 mg by mouth daily.    ? cholecalciferol (VITAMIN D) 1000 units tablet Take 1,000 Units by mouth daily.    ? Cyanocobalamin (VITAMIN B 12 PO) Take by mouth.    ? gabapentin (NEURONTIN) 300 MG capsule Take 1 capsule (300 mg total) by mouth at bedtime. 90 capsule 3  ? metoprolol succinate (TOPROL-XL) 25 MG 24 hr tablet Take 1 tablet (25 mg total) by mouth daily. 90 tablet 3  ? Phenylephrine-Acetaminophen (TYLENOL SINUS CONGESTION/PAIN PO) Take 2 tablets  by mouth 2 (two) times daily as needed.    ? traMADol (ULTRAM) 50 MG tablet Take 1 tablet (50 mg total) by mouth every 6 (six) hours as needed for moderate pain. 30 tablet 5  ? traZODone (DESYREL) 100 MG tablet Take 1 tablet (100 mg total) by mouth at bedtime. 90 tablet 3  ? ?No current facility-administered medications for this visit.  ? On toprol for tachycardia. ? ?ALLERGIES: Patient has no known allergies. ? ?Family History  ?Problem Relation Age of Onset  ? Breast cancer Mother 83  ?     lumpectomy and radiation; estrogen fed  ? Hypertension Mother   ? Colon polyps Father   ? Heart disease Father   ? Diabetes type I Sister 23  ? High Cholesterol Sister   ? Lung cancer Maternal Aunt   ? Cancer Paternal Uncle   ? Colon cancer Neg Hx   ? Esophageal cancer Neg Hx   ? Stomach cancer Neg Hx   ? Rectal cancer Neg Hx   ? ? ?Social History  ? ?Socioeconomic History  ? Marital status: Married  ?  Spouse name: Not on file  ? Number of children: 1  ? Years of education: Not on file  ? Highest education level: Not on file  ?Occupational History  ? Occupation: Administration  ?  Employer: [employer]  ?Tobacco Use  ? Smoking status: Never  ?  Passive exposure: Past (as a child)  ? Smokeless tobacco: Never  ?Vaping Use  ? Vaping Use: Never used  ?Substance and Sexual Activity  ? Alcohol use: Yes  ?  Alcohol/week: 2.0 - 3.0 standard drinks  ?  Types: 2 - 3 Standard drinks or equivalent per week  ?  Comment: 1 drink per day  ? Drug use: No  ? Sexual activity: Yes  ?  Partners: Male  ?  Birth control/protection: Post-menopausal  ?Other Topics Concern  ? Not on file  ?Social History Narrative  ? Husband with failing kidney and not candidate for transplant.  ? ?Social Determinants of Health  ? ?Financial Resource Strain: Not on file  ?Food Insecurity: Not on file  ?Transportation Needs: Not on file  ?Physical Activity: Not on file  ?Stress: Not on file  ?Social Connections: Not on file  ?Intimate Partner  Violence: Not on file  ? ? ?Review of Systems  ?All other systems reviewed and are negative. ? ?PHYSICAL EXAMINATION:   ? ?BP 126/70   Pulse 77   Ht 5' 2.75" (1.594 m)   Wt 190 lb (86.2 kg)   LMP 01/06/2014 (Approximate)  SpO2 99%   BMI 33.93 kg/m?     ?General appearance: alert, cooperative and appears stated age ?Neck: no adenopathy, supple, symmetrical, trachea midline and thyroid normal to inspection and palpation ?Heart: regular rate and rhythm ?Lungs: CTAB ?Abdomen: soft, non-tender; bowel sounds normal; no masses,  no organomegaly ?Extremities: normal, atraumatic, no cyanosis ?Skin: normal color, texture and turgor, no rashes or lesions ?Lymph: normal cervical supraclavicular and inguinal nodes ?Neurologic: grossly normal ?Pelvic: deferred ? ?1. Endometrial hyperplasia ?-Discussed total laparoscopic hysterectomy with BSO and cystoscopy. Reviewed the risks of the procedure, including infection, bleeding, damage to bowel/badder/vessels/ureters.  Discussed the possible need for laparotomy. Discussed post operative recovery and risk of cuff dehiscence. All of her questions were answered. ?-We discussed the small change of needing further surgery ? ?2. Essential hypertension ?Well controlled ? ?3. Prediabetes ?- Hemoglobin A1c ? ?

## 2022-01-04 LAB — HEMOGLOBIN A1C
Hgb A1c MFr Bld: 5.8 % of total Hgb — ABNORMAL HIGH (ref <5.7)
Mean Plasma Glucose: 120 mg/dL
eAG (mmol/L): 6.6 mmol/L

## 2022-01-09 ENCOUNTER — Other Ambulatory Visit: Payer: Self-pay

## 2022-01-09 ENCOUNTER — Encounter (HOSPITAL_BASED_OUTPATIENT_CLINIC_OR_DEPARTMENT_OTHER): Payer: Self-pay | Admitting: Obstetrics and Gynecology

## 2022-01-09 NOTE — Progress Notes (Signed)
? Your procedure is scheduled on Monday, 01/29/22. ? Report to [place] ? Call this number if you have problems the morning of surgery  :[phone]. ? ? [place].  WE ARE LOCATED IN [facility]. ? ?PLEASE BRING YOUR INSURANCE CARD AND PHOTO ID DAY OF SURGERY. ? ?ONLY 2 PEOPLE ARE ALLOWED IN  WAITING  ROOM.  ?                                   ? REMEMBER: ? DO NOT EAT FOOD, CANDY GUM OR MINTS  AFTER MIDNIGHT THE NIGHT BEFORE YOUR SURGERY . YOU MAY HAVE CLEAR LIQUIDS FROM MIDNIGHT THE NIGHT BEFORE YOUR SURGERY UNTIL  4:30 am. NO CLEAR LIQUIDS AFTER  4:30 am DAY OF SURGERY. ? ?YOU MAY  BRUSH YOUR TEETH MORNING OF SURGERY AND RINSE YOUR MOUTH OUT, NO CHEWING GUM CANDY OR MINTS. ? ? ? ? ?CLEAR LIQUID DIET ? ? ?Foods Allowed                                                                     Foods Excluded ? ?Coffee and tea, regular and decaf                             liquids that you cannot  ?Plain Jell-O any favor except red or purple                                           see through such as: ?Fruit ices (not with fruit pulp)                                     milk, soups, orange juice  ?Iced Popsicles                                    All solid food ?Carbonated beverages, regular and diet                                    ?Cranberry, grape and apple juices ?Sports drinks like Gatorade ? ?Sample Menu ?Breakfast                                Lunch                                     Supper ?Cranberry juice                                           ?  Jell-O                                     Grape juice                           Apple juice ?Coffee or tea                        Jell-O                                      Popsicle ?                                               Coffee or tea                        Coffee or tea ? ?_____________________________________________________________________ ?  ? ? TAKE THESE MEDICATIONS  MORNING OF SURGERY WITH A SIP OF WATER:   ?Cetirizine (Zyrtec), Metoprolol, Xanax if needed, Tramadol if needed ? ?TWO VISITORS ARE  ALLOWED IN WAITING ROOM ONLY DAY OF SURGERY.  ? ? UP TO 4 VISITORS  MAY VISIT IN THE EXTENDED RECOVERY ROOM UNTIL 800 PM ONLY.  1 VISITOR AGE 60 AND OVER MAY SPEND THE NIGHT AND MUST BE IN EXTENDED RECOVERY ROOM NO LATER THAN 800 PM . YOUR DISCHARGE TIME AFTER YOU SPEND THE NIGHT IS 900 AM THE MORNING AFTER YOUR SURGERY. ? ?YOU MAY PACK A SMALL OVERNIGHT BAG WITH TOILETRIES FOR YOUR OVERNIGHT STAY IF YOU WISH. ? ?YOUR PRESCRIPTION MEDICATIONS WILL BE PROVIDED DURING [place]. ? ? ?                                   ?DO NOT WEAR JEWERLY, MAKE UP. ?DO NOT WEAR LOTIONS, POWDERS, PERFUMES OR NAIL POLISH ON YOUR FINGERNAILS. TOENAIL POLISH IS OK TO WEAR. ?DO NOT SHAVE FOR 48 HOURS PRIOR TO DAY OF SURGERY. ?MEN MAY SHAVE FACE AND NECK. ?CONTACTS, GLASSES, OR DENTURES MAY NOT BE WORN TO SURGERY. ? ?REMEMBER: NO SMOKING, DRUGS OR ALCOHOL FOR 24 HOURS BEFORE YOUR SURGERY. ?                                   ?[facility] IS NOT RESPONSIBLE  FOR ANY BELONGINGS.                                  ?                                  . ?          [facility] - Preparing for Surgery ?Before surgery, you can play an important role.  Because skin is not sterile, your skin needs to be as free of germs as possible.  You can reduce the number of germs on  your skin by washing with CHG (chlorahexidine gluconate) soap before surgery.  CHG is an antiseptic cleaner which kills germs and bonds with the skin to continue killing germs even after washing. ?Please DO NOT use if you have an allergy to CHG or antibacterial soaps.  If your skin becomes reddened/irritated stop using the CHG and inform your nurse when you arrive at Short Stay. ?Do not shave (including legs and underarms) for at least 48 hours prior to the first CHG shower.  You may shave your face/neck. ?Please follow these instructions  carefully: ? 1.  Shower with CHG Soap the night before surgery and the  morning of Surgery. ? 2.  If you choose to wash your hair, wash your hair first as usual with your  normal  shampoo. ? 3.  After you shampoo, rinse your hair and body thoroughly to remove the  shampoo.                            ?4.  Use CHG as you would any other liquid soap.  You can apply chg directly  to the skin and wash  ?                    Gently with a scrungie or clean washcloth. ? 5.  Apply the CHG Soap to your body ONLY FROM THE NECK DOWN.   Do not use on face/ open      ?                     Wound or open sores. Avoid contact with eyes, ears mouth and genitals (private parts).  ?                     Production manager,  Genitals (private parts) with your normal soap. ?            6.  Wash thoroughly, paying special attention to the area where your surgery  will be performed. ? 7.  Thoroughly rinse your body with warm water from the neck down. ? 8.  DO NOT shower/wash with your normal soap after using and rinsing off  the CHG Soap. ?               9.  Pat yourself dry with a clean towel. ?           10.  Wear clean pajamas. ?           11.  Place clean sheets on your bed the night of your first shower and do not  sleep with pets. ?Day of Surgery : ?Do not apply any lotions/deodorants the morning of surgery.  Please wear clean clothes to the hospital/surgery center. ? ?IF YOU HAVE ANY SKIN IRRITATION OR PROBLEMS WITH THE SURGICAL SOAP, PLEASE GET A BAR OF GOLD DIAL SOAP AND SHOWER THE NIGHT BEFORE YOUR SURGERY AND THE MORNING OF YOUR SURGERY. PLEASE LET THE NURSE KNOW MORNING OF YOUR SURGERY IF YOU HAD ANY PROBLEMS WITH THE SURGICAL SOAP. ? ? ?________________________________________________________________________                  ?                                    ?  QUESTIONS CALL Aspasia Rude PRE OP NURSE PHONE [phone].                                    ?

## 2022-01-09 NOTE — Progress Notes (Signed)
Spoke w/ via phone for pre-op interview---[name] ?Lab needs dos---- none per anesthesia, surgeon orders pending as of 01/09/22              ?Lab results------01/25/22 lab appt for CBC, BMP, type & screen, 12/22/21 EKG in chart & Epic ?COVID test -----patient states asymptomatic no test needed ?Arrive at -------0530 on Monday, 01/29/22 ?NPO after MN NO Solid Food.  Clear liquids from MN until---0430 ?Med rec completed ?Medications to take morning of surgery -----Metoprolol, Zyrtec, Xanax prn, Tramadol prn ?Diabetic medication -----n/a ?Patient instructed no nail polish to be worn day of surgery ?Patient instructed to bring photo id and insurance card day of surgery ?Patient aware to have Driver (ride ) / caregiver    for 24 hours after surgery - daughter, [name] ?Patient Special Instructions -----Extended recovery instructions given. ?Pre-Op special Istructions -----Requested orders from Dr. Talbert Nan via Epic IB on 01/08/22 ?Patient verbalized understanding of instructions that were given at this phone interview. ?Patient denies shortness of breath, chest pain, fever, cough at this phone interview.  ? ?Patient had Hysteroscopy at [facility] on 12/26/21. Per patient, there have been no changes to medical history or medications since then. ?

## 2022-01-18 NOTE — Telephone Encounter (Signed)
Call to patient. Unable to leave voicemail requesting a return call to review benefits for Scheduled Surgery with Sumner Boast, MD.  ?

## 2022-01-24 NOTE — Telephone Encounter (Signed)
Spoke with patient regarding surgery benefits. Patient acknowledges understanding of information presented. Patient is aware that benefits presented are professional benefits only. Patient is aware the hospital will call with facility benefits. See account note. ? ?Encounter previously closed. ?

## 2022-01-25 ENCOUNTER — Encounter (HOSPITAL_COMMUNITY)
Admission: RE | Admit: 2022-01-25 | Discharge: 2022-01-25 | Disposition: A | Discharge status: Home / Self Care | Payer: BC Managed Care – PPO | Source: Ambulatory Visit | Attending: Obstetrics and Gynecology | Admitting: Obstetrics and Gynecology

## 2022-01-25 DIAGNOSIS — Z01812 Encounter for preprocedural laboratory examination: Secondary | ICD-10-CM | POA: Diagnosis not present

## 2022-01-25 DIAGNOSIS — Z01818 Encounter for other preprocedural examination: Secondary | ICD-10-CM

## 2022-01-25 LAB — CBC
HCT: 40.3 % (ref 36.0–46.0)
Hemoglobin: 13 g/dL (ref 12.0–15.0)
MCH: 30.3 pg (ref 26.0–34.0)
MCHC: 32.3 g/dL (ref 30.0–36.0)
MCV: 93.9 fL (ref 80.0–100.0)
Platelets: 294 10*3/uL (ref 150–400)
RBC: 4.29 MIL/uL (ref 3.87–5.11)
RDW: 13.1 % (ref 11.5–15.5)
WBC: 6.8 10*3/uL (ref 4.0–10.5)
nRBC: 0 % (ref 0.0–0.2)

## 2022-01-25 LAB — BASIC METABOLIC PANEL
Anion gap: 5 (ref 5–15)
BUN: 17 mg/dL (ref 6–20)
CO2: 27 mmol/L (ref 22–32)
Calcium: 9.1 mg/dL (ref 8.9–10.3)
Chloride: 108 mmol/L (ref 98–111)
Creatinine, Ser: 1.1 mg/dL — ABNORMAL HIGH (ref 0.44–1.00)
GFR, Estimated: 58 mL/min — ABNORMAL LOW (ref >60)
Glucose, Bld: 111 mg/dL — ABNORMAL HIGH (ref 70–99)
Potassium: 4.4 mmol/L (ref 3.5–5.1)
Sodium: 140 mmol/L (ref 135–145)

## 2022-01-29 ENCOUNTER — Other Ambulatory Visit: Payer: Self-pay

## 2022-01-29 ENCOUNTER — Encounter (HOSPITAL_BASED_OUTPATIENT_CLINIC_OR_DEPARTMENT_OTHER)
Admission: RE | Disposition: A | Discharge status: Home / Self Care | Payer: Self-pay | Source: Ambulatory Visit | Attending: Obstetrics and Gynecology

## 2022-01-29 ENCOUNTER — Ambulatory Visit (HOSPITAL_BASED_OUTPATIENT_CLINIC_OR_DEPARTMENT_OTHER): Payer: BC Managed Care – PPO | Admitting: Certified Registered Nurse Anesthetist

## 2022-01-29 ENCOUNTER — Telehealth: Payer: Self-pay | Admitting: Obstetrics and Gynecology

## 2022-01-29 ENCOUNTER — Encounter (HOSPITAL_BASED_OUTPATIENT_CLINIC_OR_DEPARTMENT_OTHER): Payer: Self-pay | Admitting: Obstetrics and Gynecology

## 2022-01-29 ENCOUNTER — Ambulatory Visit (HOSPITAL_BASED_OUTPATIENT_CLINIC_OR_DEPARTMENT_OTHER)
Admission: RE | Admit: 2022-01-29 | Discharge: 2022-01-29 | Disposition: A | Discharge status: Home / Self Care | Payer: BC Managed Care – PPO | Source: Ambulatory Visit | Attending: Obstetrics and Gynecology | Admitting: Obstetrics and Gynecology

## 2022-01-29 DIAGNOSIS — N8003 Adenomyosis of the uterus: Secondary | ICD-10-CM | POA: Insufficient documentation

## 2022-01-29 DIAGNOSIS — D251 Intramural leiomyoma of uterus: Secondary | ICD-10-CM | POA: Insufficient documentation

## 2022-01-29 DIAGNOSIS — N8500 Endometrial hyperplasia, unspecified: Secondary | ICD-10-CM | POA: Diagnosis not present

## 2022-01-29 DIAGNOSIS — R7303 Prediabetes: Secondary | ICD-10-CM | POA: Insufficient documentation

## 2022-01-29 DIAGNOSIS — N72 Inflammatory disease of cervix uteri: Secondary | ICD-10-CM | POA: Diagnosis not present

## 2022-01-29 DIAGNOSIS — I10 Essential (primary) hypertension: Secondary | ICD-10-CM | POA: Diagnosis not present

## 2022-01-29 DIAGNOSIS — Z90710 Acquired absence of both cervix and uterus: Secondary | ICD-10-CM | POA: Diagnosis present

## 2022-01-29 DIAGNOSIS — N879 Dysplasia of cervix uteri, unspecified: Secondary | ICD-10-CM | POA: Diagnosis not present

## 2022-01-29 DIAGNOSIS — N950 Postmenopausal bleeding: Secondary | ICD-10-CM | POA: Insufficient documentation

## 2022-01-29 DIAGNOSIS — Z01818 Encounter for other preprocedural examination: Secondary | ICD-10-CM

## 2022-01-29 DIAGNOSIS — N858 Other specified noninflammatory disorders of uterus: Secondary | ICD-10-CM | POA: Diagnosis not present

## 2022-01-29 HISTORY — PX: TOTAL LAPAROSCOPIC HYSTERECTOMY WITH BILATERAL SALPINGO OOPHORECTOMY: SHX6845

## 2022-01-29 HISTORY — PX: CYSTOSCOPY: SHX5120

## 2022-01-29 LAB — TYPE AND SCREEN
ABO/RH(D): O POS
Antibody Screen: NEGATIVE

## 2022-01-29 LAB — ABO/RH: ABO/RH(D): O POS

## 2022-01-29 SURGERY — HYSTERECTOMY, TOTAL, LAPAROSCOPIC, WITH BILATERAL SALPINGO-OOPHORECTOMY
Anesthesia: General | Site: Urethra

## 2022-01-29 MED ORDER — MIDAZOLAM HCL 2 MG/2ML IJ SOLN
INTRAMUSCULAR | Status: AC
  Filled 2022-01-29: qty 2

## 2022-01-29 MED ORDER — ACETAMINOPHEN 500 MG PO TABS
ORAL_TABLET | ORAL | Status: AC
  Filled 2022-01-29: qty 2

## 2022-01-29 MED ORDER — 0.9 % SODIUM CHLORIDE (POUR BTL) OPTIME
TOPICAL | Status: DC | PRN
  Administered 2022-01-29: 500 mL

## 2022-01-29 MED ORDER — SODIUM CHLORIDE 0.9 % IV SOLN
INTRAVENOUS | Status: DC | PRN
  Administered 2022-01-29: 60 mL

## 2022-01-29 MED ORDER — DEXAMETHASONE SODIUM PHOSPHATE 10 MG/ML IJ SOLN
INTRAMUSCULAR | Status: DC | PRN
  Administered 2022-01-29: 10 mg via INTRAVENOUS

## 2022-01-29 MED ORDER — GLYCOPYRROLATE PF 0.2 MG/ML IJ SOSY
PREFILLED_SYRINGE | INTRAMUSCULAR | Status: AC
  Filled 2022-01-29: qty 1

## 2022-01-29 MED ORDER — GABAPENTIN 300 MG PO CAPS
300.0000 mg | ORAL_CAPSULE | ORAL | Status: AC
  Administered 2022-01-29: 300 mg via ORAL

## 2022-01-29 MED ORDER — HYDROMORPHONE HCL 1 MG/ML IJ SOLN
INTRAMUSCULAR | Status: AC
  Filled 2022-01-29: qty 1

## 2022-01-29 MED ORDER — KETOROLAC TROMETHAMINE 30 MG/ML IJ SOLN
INTRAMUSCULAR | Status: DC | PRN
  Administered 2022-01-29: 30 mg via INTRAVENOUS

## 2022-01-29 MED ORDER — SODIUM CHLORIDE 0.9 % IR SOLN
Status: DC | PRN
  Administered 2022-01-29 (×2): 1000 mL

## 2022-01-29 MED ORDER — PROPOFOL 10 MG/ML IV BOLUS
INTRAVENOUS | Status: AC
  Filled 2022-01-29: qty 20

## 2022-01-29 MED ORDER — LACTATED RINGERS IV SOLN: INTRAVENOUS | Status: DC

## 2022-01-29 MED ORDER — BUPIVACAINE HCL (PF) 0.25 % IJ SOLN
INTRAMUSCULAR | Status: DC | PRN
Start: 2022-01-29 — End: 2022-01-29
  Administered 2022-01-29: 9 mL

## 2022-01-29 MED ORDER — LIDOCAINE HCL (PF) 2 % IJ SOLN
INTRAMUSCULAR | Status: AC
  Filled 2022-01-29: qty 15

## 2022-01-29 MED ORDER — SODIUM CHLORIDE 0.9 % IV SOLN
2.0000 g | INTRAVENOUS | Status: AC
  Administered 2022-01-29: 2 g via INTRAVENOUS

## 2022-01-29 MED ORDER — HYDROMORPHONE HCL 1 MG/ML IJ SOLN
0.2500 mg | INTRAMUSCULAR | Status: DC | PRN
  Administered 2022-01-29 (×2): 0.25 mg via INTRAVENOUS

## 2022-01-29 MED ORDER — DOCUSATE SODIUM 100 MG PO CAPS: 100.0000 mg | ORAL_CAPSULE | Freq: Two times a day (BID) | ORAL | 2 refills | Status: AC

## 2022-01-29 MED ORDER — GABAPENTIN 300 MG PO CAPS
ORAL_CAPSULE | ORAL | Status: AC
  Filled 2022-01-29: qty 1

## 2022-01-29 MED ORDER — OXYCODONE HCL 5 MG PO TABS: 5.0000 mg | ORAL_TABLET | ORAL | 0 refills | Status: DC | PRN

## 2022-01-29 MED ORDER — DEXAMETHASONE SODIUM PHOSPHATE 10 MG/ML IJ SOLN
INTRAMUSCULAR | Status: AC
  Filled 2022-01-29: qty 1

## 2022-01-29 MED ORDER — ONDANSETRON HCL 4 MG/2ML IJ SOLN
INTRAMUSCULAR | Status: AC
  Filled 2022-01-29: qty 2

## 2022-01-29 MED ORDER — ACETAMINOPHEN 500 MG PO TABS
1000.0000 mg | ORAL_TABLET | ORAL | Status: AC
  Administered 2022-01-29: 1000 mg via ORAL

## 2022-01-29 MED ORDER — OXYCODONE HCL 5 MG PO TABS: 5.0000 mg | ORAL_TABLET | Freq: Once | ORAL | Status: DC | PRN

## 2022-01-29 MED ORDER — ENOXAPARIN SODIUM 40 MG/0.4ML IJ SOSY
40.0000 mg | PREFILLED_SYRINGE | INTRAMUSCULAR | Status: AC
  Administered 2022-01-29: 40 mg via SUBCUTANEOUS

## 2022-01-29 MED ORDER — PROPOFOL 10 MG/ML IV BOLUS
INTRAVENOUS | Status: DC | PRN
  Administered 2022-01-29: 140 mg via INTRAVENOUS

## 2022-01-29 MED ORDER — KETOROLAC TROMETHAMINE 30 MG/ML IJ SOLN: 30.0000 mg | Freq: Once | INTRAMUSCULAR | Status: DC | PRN

## 2022-01-29 MED ORDER — ONDANSETRON HCL 4 MG/2ML IJ SOLN
INTRAMUSCULAR | Status: DC | PRN
  Administered 2022-01-29: 4 mg via INTRAVENOUS

## 2022-01-29 MED ORDER — DEXAMETHASONE SODIUM PHOSPHATE 10 MG/ML IJ SOLN
INTRAMUSCULAR | Status: AC
  Filled 2022-01-29: qty 3

## 2022-01-29 MED ORDER — FENTANYL CITRATE (PF) 250 MCG/5ML IJ SOLN
INTRAMUSCULAR | Status: AC
  Filled 2022-01-29: qty 5

## 2022-01-29 MED ORDER — LIDOCAINE 2% (20 MG/ML) 5 ML SYRINGE
INTRAMUSCULAR | Status: DC | PRN
Start: 2022-01-29 — End: 2022-01-29
  Administered 2022-01-29: 100 mg via INTRAVENOUS

## 2022-01-29 MED ORDER — ONDANSETRON HCL 4 MG/2ML IJ SOLN: 4.0000 mg | Freq: Once | INTRAMUSCULAR | Status: DC | PRN

## 2022-01-29 MED ORDER — FENTANYL CITRATE (PF) 250 MCG/5ML IJ SOLN
INTRAMUSCULAR | Status: DC | PRN
  Administered 2022-01-29: 50 ug via INTRAVENOUS
  Administered 2022-01-29 (×3): 25 ug via INTRAVENOUS
  Administered 2022-01-29: 50 ug via INTRAVENOUS

## 2022-01-29 MED ORDER — MIDAZOLAM HCL 2 MG/2ML IJ SOLN
INTRAMUSCULAR | Status: DC | PRN
  Administered 2022-01-29: 2 mg via INTRAVENOUS

## 2022-01-29 MED ORDER — KETOROLAC TROMETHAMINE 30 MG/ML IJ SOLN
INTRAMUSCULAR | Status: AC
  Filled 2022-01-29: qty 3

## 2022-01-29 MED ORDER — SUGAMMADEX SODIUM 200 MG/2ML IV SOLN
INTRAVENOUS | Status: DC | PRN
  Administered 2022-01-29: 200 mg via INTRAVENOUS

## 2022-01-29 MED ORDER — DROPERIDOL 2.5 MG/ML IJ SOLN
INTRAMUSCULAR | Status: DC | PRN
  Administered 2022-01-29: .625 mg via INTRAVENOUS

## 2022-01-29 MED ORDER — ROCURONIUM BROMIDE 10 MG/ML (PF) SYRINGE
PREFILLED_SYRINGE | INTRAVENOUS | Status: DC | PRN
  Administered 2022-01-29: 70 mg via INTRAVENOUS

## 2022-01-29 MED ORDER — ACETAMINOPHEN 500 MG PO TABS: 1000.0000 mg | ORAL_TABLET | Freq: Four times a day (QID) | ORAL | 2 refills | Status: AC | PRN

## 2022-01-29 MED ORDER — LIDOCAINE HCL (PF) 2 % IJ SOLN
INTRAMUSCULAR | Status: AC
  Filled 2022-01-29: qty 5

## 2022-01-29 MED ORDER — OXYCODONE HCL 5 MG/5ML PO SOLN: 5.0000 mg | Freq: Once | ORAL | Status: DC | PRN

## 2022-01-29 MED ORDER — DEXTROSE 10 % IV SOLN
INTRAVENOUS | Status: AC | PRN
  Administered 2022-01-29: 100 mL via INTRAVENOUS

## 2022-01-29 MED ORDER — GLYCOPYRROLATE 0.2 MG/ML IJ SOLN
INTRAMUSCULAR | Status: DC | PRN
Start: 2022-01-29 — End: 2022-01-29
  Administered 2022-01-29: .2 mg via INTRAVENOUS

## 2022-01-29 MED ORDER — SODIUM CHLORIDE 0.9 % IV SOLN
INTRAVENOUS | Status: AC
  Filled 2022-01-29: qty 2

## 2022-01-29 MED ORDER — ONDANSETRON HCL 4 MG/2ML IJ SOLN
INTRAMUSCULAR | Status: AC
  Filled 2022-01-29: qty 4

## 2022-01-29 MED ORDER — ROCURONIUM BROMIDE 10 MG/ML (PF) SYRINGE
PREFILLED_SYRINGE | INTRAVENOUS | Status: AC
  Filled 2022-01-29: qty 10

## 2022-01-29 MED ORDER — ENOXAPARIN SODIUM 40 MG/0.4ML IJ SOSY
PREFILLED_SYRINGE | INTRAMUSCULAR | Status: AC
  Filled 2022-01-29: qty 0.4

## 2022-01-29 MED ORDER — POVIDONE-IODINE 10 % EX SWAB: 2.0000 "application " | Freq: Once | CUTANEOUS | Status: DC

## 2022-01-29 MED ORDER — IBUPROFEN 800 MG PO TABS: 800.0000 mg | ORAL_TABLET | Freq: Three times a day (TID) | ORAL | 0 refills | Status: DC | PRN

## 2022-01-29 SURGICAL SUPPLY — 51 items
ADH SKN CLS APL DERMABOND .7 (GAUZE/BANDAGES/DRESSINGS) ×2
APL SWBSTK 6 STRL LF DISP (MISCELLANEOUS) ×2
APPLICATOR COTTON TIP 6 STRL (MISCELLANEOUS) IMPLANT
APPLICATOR COTTON TIP 6IN STRL (MISCELLANEOUS) ×3
BAG DECANTER FOR FLEXI CONT (MISCELLANEOUS) ×1 IMPLANT
CABLE HIGH FREQUENCY MONO STRZ (ELECTRODE) ×1 IMPLANT
COVER BACK TABLE 60X90IN (DRAPES) ×1 IMPLANT
COVER MAYO STAND STRL (DRAPES) ×4 IMPLANT
DECANTER SPIKE VIAL GLASS SM (MISCELLANEOUS) ×4 IMPLANT
DERMABOND ADVANCED (GAUZE/BANDAGES/DRESSINGS) ×1
DERMABOND ADVANCED .7 DNX12 (GAUZE/BANDAGES/DRESSINGS) ×3 IMPLANT
DRSG TEGADERM 4X4.75 (GAUZE/BANDAGES/DRESSINGS) ×2 IMPLANT
DURAPREP 26ML APPLICATOR (WOUND CARE) ×4 IMPLANT
GAUZE 4X4 16PLY ~~LOC~~+RFID DBL (SPONGE) ×9 IMPLANT
GLOVE BIO SURGEON STRL SZ 6.5 (GLOVE) ×10 IMPLANT
GLOVE BIOGEL PI IND STRL 7.0 (GLOVE) ×12 IMPLANT
GLOVE BIOGEL PI INDICATOR 7.0 (GLOVE) ×8
HARMONIC RUM II 2.5CM SILVER (DISPOSABLE) ×3
HIBICLENS CHG 4% 4OZ (MISCELLANEOUS) ×2 IMPLANT
IV NS 1000ML (IV SOLUTION) ×6
IV NS 1000ML BAXH (IV SOLUTION) IMPLANT
KIT TURNOVER CYSTO (KITS) ×4 IMPLANT
LIGASURE VESSEL 5MM BLUNT TIP (ELECTROSURGICAL) ×4 IMPLANT
MANIFOLD NEPTUNE II (INSTRUMENTS) ×1 IMPLANT
NEEDLE INSUFFLATION 120MM (ENDOMECHANICALS) ×4 IMPLANT
PACK LAPAROSCOPY BASIN (CUSTOM PROCEDURE TRAY) ×4 IMPLANT
PACK ROBOTIC GOWN (GOWN DISPOSABLE) ×1 IMPLANT
PACK TRENDGUARD 450 HYBRID PRO (MISCELLANEOUS) IMPLANT
POUCH LAPAROSCOPIC INSTRUMENT (MISCELLANEOUS) ×4 IMPLANT
PROTECTOR NERVE ULNAR (MISCELLANEOUS) ×8 IMPLANT
SCALPEL HRMNC RUM II 2.5 SILVR (DISPOSABLE) IMPLANT
SET IRRIG Y TYPE TUR BLADDER L (SET/KITS/TRAYS/PACK) ×4 IMPLANT
SET SUCTION IRRIG HYDROSURG (IRRIGATION / IRRIGATOR) ×4 IMPLANT
SET TRI-LUMEN FLTR TB AIRSEAL (TUBING) ×4 IMPLANT
SHEARS 1100 HARMONIC 36 (ELECTROSURGICAL) ×4 IMPLANT
SOL PREP POV-IOD 4OZ 10% (MISCELLANEOUS) ×1 IMPLANT
SUT VIC AB 0 CT1 36 (SUTURE) ×4 IMPLANT
SUT VIC AB 4-0 PS2 18 (SUTURE) ×4 IMPLANT
SUT VICRYL 0 UR6 27IN ABS (SUTURE) ×1 IMPLANT
SUT VLOC 180 0 9IN  GS21 (SUTURE) ×3
SUT VLOC 180 0 9IN GS21 (SUTURE) IMPLANT
SYR 50ML LL SCALE MARK (SYRINGE) ×8 IMPLANT
TIP UTERINE 6.7X8CM BLUE DISP (MISCELLANEOUS) ×1 IMPLANT
TOWEL OR 17X26 10 PK STRL BLUE (TOWEL DISPOSABLE) ×4 IMPLANT
TRAY FOLEY W/BAG SLVR 14FR LF (SET/KITS/TRAYS/PACK) ×4 IMPLANT
TRENDGUARD 450 HYBRID PRO PACK (MISCELLANEOUS) ×3
TROCAR ADV FIXATION 5X100MM (TROCAR) ×4 IMPLANT
TROCAR BLADELESS OPT 5 100 (ENDOMECHANICALS) ×4 IMPLANT
TROCAR PORT AIRSEAL 5X120 (TROCAR) ×4 IMPLANT
TROCAR XCEL NON BLADE 8MM B8LT (ENDOMECHANICALS) ×4 IMPLANT
WARMER LAPAROSCOPE (MISCELLANEOUS) ×4 IMPLANT

## 2022-01-29 NOTE — Anesthesia Procedure Notes (Signed)
Procedure Name: Intubation ?Date/Time: 01/29/2022 7:41 AM ?Performed by: Clearnce Sorrel, CRNA ?Pre-anesthesia Checklist: Patient identified, Emergency Drugs available, Suction available and Patient being monitored ?Patient Re-evaluated:Patient Re-evaluated prior to induction ?Oxygen Delivery Method: Circle System Utilized ?Preoxygenation: Pre-oxygenation with 100% oxygen ?Induction Type: IV induction ?Ventilation: Mask ventilation without difficulty ?Laryngoscope Size: Mac and 3 ?Tube type: Oral ?Tube size: 7.0 mm ?Number of attempts: 1 ?Airway Equipment and Method: Stylet and Oral airway ?Placement Confirmation: ETT inserted through vocal cords under direct vision, positive ETCO2 and breath sounds checked- equal and bilateral ?Secured at: 22 cm ?Tube secured with: Tape ?Dental Injury: Teeth and Oropharynx as per pre-operative assessment  ? ? ? ? ?

## 2022-01-29 NOTE — Anesthesia Preprocedure Evaluation (Signed)
Anesthesia Evaluation  ?Patient identified by MRN, date of birth, ID band ?Patient awake ? ? ? ?Reviewed: ?Allergy & Precautions, NPO status , Patient's Chart, lab work & pertinent test results ? ?Airway ?Mallampati: II ? ?TM Distance: >3 FB ?Neck ROM: Full ? ? ? Dental ?no notable dental hx. ? ?  ?Pulmonary ?neg pulmonary ROS,  ?  ?Pulmonary exam normal ?breath sounds clear to auscultation ? ? ? ? ? ? Cardiovascular ?hypertension, Pt. on medications ?Normal cardiovascular exam ?Rhythm:Regular Rate:Normal ? ? ?  ?Neuro/Psych ?negative neurological ROS ? negative psych ROS  ? GI/Hepatic ?negative GI ROS, Neg liver ROS,   ?Endo/Other  ?negative endocrine ROS ? Renal/GU ?negative Renal ROS  ?negative genitourinary ?  ?Musculoskeletal ?negative musculoskeletal ROS ?(+)  ? Abdominal ?  ?Peds ?negative pediatric ROS ?(+)  Hematology ?negative hematology ROS ?(+)   ?Anesthesia Other Findings ? ? Reproductive/Obstetrics ?negative OB ROS ? ?  ? ? ? ? ? ? ? ? ? ? ? ? ? ?  ?  ? ? ? ? ? ? ? ? ?Anesthesia Physical ?Anesthesia Plan ? ?ASA: 2 ? ?Anesthesia Plan: General  ? ?Post-op Pain Management: Dilaudid IV  ? ?Induction: Intravenous ? ?PONV Risk Score and Plan: 3 and Ondansetron, Dexamethasone, Droperidol and Treatment may vary due to age or medical condition ? ?Airway Management Planned: Oral ETT ? ?Additional Equipment:  ? ?Intra-op Plan:  ? ?Post-operative Plan: Extubation in OR ? ?Informed Consent: I have reviewed the patients History and Physical, chart, labs and discussed the procedure including the risks, benefits and alternatives for the proposed anesthesia with the patient or authorized representative who has indicated his/her understanding and acceptance.  ? ? ? ?Dental advisory given ? ?Plan Discussed with: CRNA and Surgeon ? ?Anesthesia Plan Comments:   ? ? ? ? ? ? ?Anesthesia Quick Evaluation ? ?

## 2022-01-29 NOTE — Op Note (Signed)
Preoperative Diagnosis: Endometrial hyperplasia ? ?Postoperative Diagnosis: Endometrial hyperplasia ? ?Procedure:  Total Laparoscopic Hysterectomy with bilateral salpingo oophorectomy and cystoscopy ? ?Surgeon: Dr Sumner Boast ? ?Assistant: Dr Eppie Gibson, an MD assistant was necessary for tissue manipulation, retraction and positioning due to the complexity of the case and hospital policies ? ? ?Anesthesia: General ? ?EBL: 25 ? ?Fluids: 650 cc ? ?Urine output: 250 cc ? ?Indications for surgery: The patient is a 61 year old female, who presented with postmenopausal bleeding. She underwent a hysteroscopy, polypectomy, D&C on 01/03/22 that returned with focal endometrial intraepithelial neoplasia within an endometrial polyp. No malignancy was identified. Hysterectomy was recommended.  ?The patient is aware of the risks and complications involved with the surgery and consent was obtained prior to the procedure. ? ?Findings: Normal sized uterus, normal adnexa bilaterally. Normal bladder mucosa, normal ureteral jets bilaterally.  ? ?Procedure: The patient was taken to the operating room with an IV in placed, preoperative antibiotics had been administered. She was placed in the dorsal lithotomy position. General anesthesia was administered. She was prepped and draped in the usual sterile fashion for an abdominal, vaginal surgery. A rumi uterine manipulator was placed, using a # 2.5 cup and a 8 cm extender. A foley catheter was placed.  ? ? ?The umbilicus was everted, injected with 0.25% marcaine and incised with a # 11 blade. 2 towel clips were used to elevated the umbilicus and a veress needle was placed into the abdominal cavity. The abdominal cavity was insufflated with CO2, with normal intraabdominal pressures. After adequate pneumo-insufflation the veress needle was removed and the 5 mm laparoscope was placed into the abdominal cavity using the opti-view trocar. The patient was placed in trendelenburg and the abdominal  pelvic cavity was inspected. 3 more trocars were placed: 1 in each lower quadrant approximately 3 cm medial to and superior to the anterior superior iliac spine and one in the midline approximately 6 cm above the pubic symphysis in the midline. These areas were injected with 0.25% marcaine, incised with a #11 blade and all trocars were inserted with direct visualization with the laparoscope. A # 5 airseal trocar was placed in the RLQ, a 5 mm trocar in the LLQ and a #8 trocar in the midline. The abdominal pelvic cavity was again inspected. A mixture of 30 cc of Robivacaine and 30 cc of NS was place in the pelvic cavity.  ? ?The left infundibulopelvic was elevated from the pelvic sidewall, cauterized and cut with the ligasure device. The mesosalpinx and mesovarium were cauterized and cut with the ligasure device.  The left round ligament was cauterized and cut with the ligasure device and the anterior and posterior leafs of the broad ligament were taken down with the ligasure device. The harmonic scalpel was then used to take down the bladder flap and skeltonize the vessels. The left uterine vessels were then clamped, cauterized and ligated with the ligasure device. Hemostasis was excellent. The same procedure was repeated on the right.  ? ?Using the rumi manipulator the uterus was pushed up in the pelvic cavity and the harmonic scalpel was used to separate the cervix from the vagina using the harmonic energy. The uterus and bilateral adnexa were removed vaginally at this time. An occluder was placed in the vagina to maintain pneumoperitoneum. The vaginal cuff was then closed with a 0 V-lock suture. Hemostasis was excellent. The abdominal pelvic cavity was irrigated and suctioned dry. Pressure was released and hemostasis remained excellent.  ? ?The abdominal  cavity was desufflated and the trocars were removed. The skin was closed with subcuticular stiches of 4-0 vicryl and dermabond was placed over the  incisions. ? ?The foley catheter was removed and cystoscopy was performed using a 70 degree scope. Both ureters expelled urine, no bladder abnormalities were noted. The bladder was allowed to drain and the cystoscope was removed.  ? ?The patient's abdomen and perineum were cleansed and she was taken out of the dorsal lithotomy position. Upon awakening she was extubated and taken to the recovery room in stable condition. The sponge and instrument counts were correct.  ? ?

## 2022-01-29 NOTE — Transfer of Care (Signed)
Immediate Anesthesia Transfer of Care Note ? ?Patient: Jeanne Parks ? ?Procedure(s) Performed: TOTAL LAPAROSCOPIC HYSTERECTOMY WITH BILATERAL SALPINGO OOPHORECTOMY (Bilateral: Abdomen) ?CYSTOSCOPY (Urethra) ? ?Patient Location: PACU ? ?Anesthesia Type:General ? ?Level of Consciousness: drowsy ? ?Airway & Oxygen Therapy: Patient Spontanous Breathing ? ?Post-op Assessment: Report given to RN and Post -op Vital signs reviewed and stable ? ?Post vital signs: Reviewed and stable ? ?Last Vitals:  ?Vitals Value Taken Time  ?BP 111/58 01/29/22 0930  ?Temp    ?Pulse 65 01/29/22 0932  ?Resp 14 01/29/22 0932  ?SpO2 97 % 01/29/22 0932  ?Vitals shown include unvalidated device data. ? ?Last Pain:  ?Vitals:  ? 01/29/22 0644  ?TempSrc: Oral  ?PainSc: 0-No pain  ?   ? ?Patients Stated Pain Goal: 5 (01/29/22 1464) ? ?Complications: No notable events documented. ?

## 2022-01-29 NOTE — Telephone Encounter (Signed)
Mychart message sent.

## 2022-01-29 NOTE — Addendum Note (Signed)
Addendum  created 01/29/22 1204 by Clearnce Sorrel, CRNA  ? MAR administration accepted  ?  ?

## 2022-01-29 NOTE — Anesthesia Postprocedure Evaluation (Signed)
Anesthesia Post Note ? ?Patient: AZIYAH PROVENCAL ? ?Procedure(s) Performed: TOTAL LAPAROSCOPIC HYSTERECTOMY WITH BILATERAL SALPINGO OOPHORECTOMY (Bilateral: Abdomen) ?CYSTOSCOPY (Urethra) ? ?  ? ?Patient location during evaluation: PACU ?Anesthesia Type: General ?Level of consciousness: awake and alert ?Pain management: pain level controlled ?Vital Signs Assessment: post-procedure vital signs reviewed and stable ?Respiratory status: spontaneous breathing, nonlabored ventilation, respiratory function stable and patient connected to nasal cannula oxygen ?Cardiovascular status: blood pressure returned to baseline and stable ?Postop Assessment: no apparent nausea or vomiting ?Anesthetic complications: no ? ? ?No notable events documented. ? ?Last Vitals:  ?Vitals:  ? 01/29/22 0644 01/29/22 0930  ?BP: 137/78 (!) 111/58  ?Pulse: 79 63  ?Resp: 17 17  ?Temp: 36.8 ?C (!) 34.7 ?C  ?SpO2: 97% 98%  ?  ?Last Pain:  ?Vitals:  ? 01/29/22 0955  ?TempSrc:   ?PainSc: 9   ? ? ?  ?  ?  ?  ?  ?  ? ?Gari Trovato S ? ? ? ? ?

## 2022-01-29 NOTE — Interval H&P Note (Signed)
History and Physical Interval Note: ? ?01/29/2022 ?7:12 AM ? ?Jeanne Parks  has presented today for surgery, with the diagnosis of Postmenopausal bleeidng, endometrial hyperplasia.  The various methods of treatment have been discussed with the patient and family. After consideration of risks, benefits and other options for treatment, the patient has consented to  Procedure(s): ?TOTAL LAPAROSCOPIC HYSTERECTOMY WITH BILATERAL SALPINGO OOPHORECTOMY (Bilateral) ?CYSTOSCOPY (N/A) as a surgical intervention.  The patient's history has been reviewed, patient examined, no change in status, stable for surgery.  I have reviewed the patient's chart and labs.  Questions were answered to the patient's satisfaction.   ? ? ?[name] ? ? ?

## 2022-01-29 NOTE — Discharge Instructions (Addendum)

## 2022-01-30 ENCOUNTER — Encounter (HOSPITAL_BASED_OUTPATIENT_CLINIC_OR_DEPARTMENT_OTHER): Payer: Self-pay | Admitting: Obstetrics and Gynecology

## 2022-01-30 ENCOUNTER — Ambulatory Visit: Payer: BC Managed Care – PPO | Admitting: Family Medicine

## 2022-01-30 LAB — SURGICAL PATHOLOGY

## 2022-02-05 ENCOUNTER — Ambulatory Visit (INDEPENDENT_AMBULATORY_CARE_PROVIDER_SITE_OTHER): Payer: BC Managed Care – PPO | Admitting: Obstetrics and Gynecology

## 2022-02-05 ENCOUNTER — Encounter: Payer: Self-pay | Admitting: Obstetrics and Gynecology

## 2022-02-05 VITALS — BP 124/84

## 2022-02-05 DIAGNOSIS — Z90710 Acquired absence of both cervix and uterus: Secondary | ICD-10-CM

## 2022-02-05 NOTE — Progress Notes (Addendum)
GYNECOLOGY  VISIT ?  ?HPI: ?61 y.o.   Married White or Caucasian Not Hispanic or Latino  female   ?[id number] with Patient's last menstrual period was 01/06/2014 (approximate).   ?here for  post op check. She is one week s/p TLH/BSO for endometrial hyperplasia. Pathology was benign. Doing well. Normal bowel and bladder function. No vaginal bleeding. Minimal discomfort.  ? ?GYNECOLOGIC HISTORY: ?Patient's last menstrual period was 01/06/2014 (approximate). ?Contraception:Hyst ?Menopausal hormone therapy: None ?       ?OB History   ? ? Gravida  ?2  ? Para  ?1  ? Term  ?1  ? Preterm  ?0  ? AB  ?1  ? Living  ?1  ?  ? ? SAB  ?1  ? IAB  ?0  ? Ectopic  ?0  ? Multiple  ?0  ? Live Births  ?1  ?   ?  ?  ?    ? ?Patient Active Problem List  ? Diagnosis Date Noted  ? S/P laparoscopic hysterectomy 01/29/2022  ? DJD (degenerative joint disease), lumbar 06/06/2020  ? Mixed hyperlipidemia 09/28/2019  ? Essential hypertension 09/28/2019  ? Sinus tachycardia 07/24/2019  ? Insulin resistance 05/25/2019  ? Situational mixed anxiety and depressive disorder 02/16/2019  ? Obesity (BMI 30-39.9) 08/20/2018  ? Primary insomnia 08/20/2018  ? OA (osteoarthritis) of right knee, s/p arthroscopy   ? Seasonal allergies   ? ? ?Past Medical History:  ?Diagnosis Date  ? Allergy   ? environmental and seasonal  ? Anxiety   ? DJD (degenerative joint disease), lumbar 06/06/2020  ? X-rays August 2021  ? History of chicken pox   ? as child  ? HLD (hyperlipidemia)   ? HSV-1 infection   ? Hypertension   ? OA (osteoarthritis) of right knee, s/p arthroscopy   ? PMB (postmenopausal bleeding)   ? Seasonal allergies   ? Skin cancer   ? chest and back years ago basal cell carcinoma  ? Tachycardia 12/21/2021  ? managed by pcp  ? Tooth size and form abnormality 12/21/2021  ? 1 lower front tooth root dying per pt  ? Wears glasses for reading   ? ? ?Past Surgical History:  ?Procedure Laterality Date  ? BREAST CYST EXCISION Left 10+ yrs ago  ? CESAREAN SECTION  08/27/1986   ? colonscopy  2021  ? CYSTOSCOPY N/A 01/29/2022  ? Procedure: CYSTOSCOPY;  Surgeon: [name], MD;  Location: [facility];  Service: Gynecology;  Laterality: N/A;  ? DILATATION & CURETTAGE/HYSTEROSCOPY WITH MYOSURE N/A 12/26/2021  ? Procedure: DILATATION & CURETTAGE/HYSTEROSCOPY WITH MYOSURE;  Surgeon: [name], MD;  Location: [facility];  Service: Gynecology;  Laterality: N/A;  ? [place] OF UTERUS  1997  ? Miscarriage  ? KNEE ARTHROSCOPY Right 02/2015  ? TONSILLECTOMY  1967  ? TOTAL LAPAROSCOPIC HYSTERECTOMY WITH BILATERAL SALPINGO OOPHORECTOMY Bilateral 01/29/2022  ? Procedure: TOTAL LAPAROSCOPIC HYSTERECTOMY WITH BILATERAL SALPINGO OOPHORECTOMY;  Surgeon: [name], MD;  Location: [facility];  Service: Gynecology;  Laterality: Bilateral;  ? ? ?Current Outpatient Medications  ?Medication Sig Dispense Refill  ? acetaminophen (TYLENOL) 500 MG tablet Take 1,000 mg by mouth every 6 (six) hours as needed.    ? acetaminophen (TYLENOL) 500 MG tablet Take 2 tablets (1,000 mg total) by mouth every 6 (six) hours as needed. 100 tablet 2  ? ALPRAZolam (XANAX) 0.5 MG tablet Take 1 tablet (0.5 mg total) by mouth 2 (two) times daily as  needed for anxiety. 30 tablet 0  ? Biotin 10000 MCG TABS Take by mouth.    ? cetirizine (ZYRTEC) 10 MG tablet Take 10 mg by mouth daily.    ? cholecalciferol (VITAMIN D) 1000 units tablet Take 1,000 Units by mouth daily.    ? Cyanocobalamin (VITAMIN B 12 PO) Take by mouth.    ? docusate sodium (COLACE) 100 MG capsule Take 1 capsule (100 mg total) by mouth 2 (two) times daily. 60 capsule 2  ? gabapentin (NEURONTIN) 300 MG capsule Take 1 capsule (300 mg total) by mouth at bedtime. 90 capsule 3  ? ibuprofen (ADVIL) 800 MG tablet Take 1 tablet (800 mg total) by mouth every 8 (eight) hours as needed. 30 tablet 0  ? metoprolol succinate (TOPROL-XL) 25 MG 24 hr tablet Take 1 tablet (25 mg total) by mouth daily.  90 tablet 3  ? traZODone (DESYREL) 100 MG tablet Take 1 tablet (100 mg total) by mouth at bedtime. 90 tablet 3  ? oxyCODONE (OXY IR/ROXICODONE) 5 MG immediate release tablet Take 1 tablet (5 mg total) by mouth every 4 (four) hours as needed for severe pain. (Patient not taking: Reported on 02/05/2022) 30 tablet 0  ? ?No current facility-administered medications for this visit.  ?  ? ?ALLERGIES: Patient has no known allergies. ? ?Family History  ?Problem Relation Age of Onset  ? Breast cancer Mother 10  ?     lumpectomy and radiation; estrogen fed  ? Hypertension Mother   ? Colon polyps Father   ? Heart disease Father   ? Diabetes type I Sister 13  ? High Cholesterol Sister   ? Lung cancer Maternal Aunt   ? Cancer Paternal Uncle   ? Colon cancer Neg Hx   ? Esophageal cancer Neg Hx   ? Stomach cancer Neg Hx   ? Rectal cancer Neg Hx   ? ? ?Social History  ? ?Socioeconomic History  ? Marital status: Married  ?  Spouse name: Not on file  ? Number of children: 1  ? Years of education: Not on file  ? Highest education level: Not on file  ?Occupational History  ? Occupation: Administration  ?  Employer: [employer]  ?Tobacco Use  ? Smoking status: Never  ?  Passive exposure: Past (as a child)  ? Smokeless tobacco: Never  ?Vaping Use  ? Vaping Use: Never used  ?Substance and Sexual Activity  ? Alcohol use: Yes  ?  Alcohol/week: 2.0 - 3.0 standard drinks  ?  Types: 2 - 3 Standard drinks or equivalent per week  ?  Comment: 1 drink per day  ? Drug use: No  ? Sexual activity: Yes  ?  Partners: Male  ?  Birth control/protection: Post-menopausal  ?Other Topics Concern  ? Not on file  ?Social History Narrative  ? Husband with failing kidney and not candidate for transplant.  ? ?Social Determinants of Health  ? ?Financial Resource Strain: Not on file  ?Food Insecurity: Not on file  ?Transportation Needs: Not on file  ?Physical Activity: Not on file  ?Stress: Not on file  ?Social Connections: Not on file  ?Intimate  Partner Violence: Not on file  ? ? ?ROS ? ?PHYSICAL EXAMINATION:   ? ?BP 124/84   LMP 01/06/2014 (Approximate)     ?General appearance: alert, cooperative and appears stated age ?Abdomen: soft, non-tender; non distended, no masses,  no organomegaly ?Incisions: some ecchymosis around her incisions, healing well, not indurated.  ? ?  Addendum: ?1. S/P laparoscopic hysterectomy ?Doing well ?Routine f/u ? ?

## 2022-02-20 DIAGNOSIS — I2510 Atherosclerotic heart disease of native coronary artery without angina pectoris: Secondary | ICD-10-CM | POA: Diagnosis not present

## 2022-02-27 ENCOUNTER — Ambulatory Visit (INDEPENDENT_AMBULATORY_CARE_PROVIDER_SITE_OTHER): Payer: BC Managed Care – PPO | Admitting: Obstetrics and Gynecology

## 2022-02-27 ENCOUNTER — Encounter: Payer: Self-pay | Admitting: Obstetrics and Gynecology

## 2022-02-27 VITALS — BP 122/82 | Wt 189.0 lb

## 2022-02-27 DIAGNOSIS — Z90710 Acquired absence of both cervix and uterus: Secondary | ICD-10-CM

## 2022-02-27 NOTE — Progress Notes (Signed)
GYNECOLOGY  VISIT   HPI: 61 y.o.   Married White or Caucasian Not Hispanic or Latino  female   [phone] with Patient's last menstrual period was 01/06/2014 (approximate).   here for a post op visit. She is one month s/p TLH/BSO for endometrial hyperplasia. Pathology was benign.  She is still tired and sore, but is definitely improving. No bleeding, normal bowel or bladder c/o.   GYNECOLOGIC HISTORY: Patient's last menstrual period was 01/06/2014 (approximate). Contraception: Hysterectomy Menopausal hormone therapy: no        OB History     Gravida  2   Para  1   Term  1   Preterm  0   AB  1   Living  1      SAB  1   IAB  0   Ectopic  0   Multiple  0   Live Births  1              Patient Active Problem List   Diagnosis Date Noted   S/P laparoscopic hysterectomy 01/29/2022   DJD (degenerative joint disease), lumbar 06/06/2020   Mixed hyperlipidemia 09/28/2019   Essential hypertension 09/28/2019   Sinus tachycardia 07/24/2019   Insulin resistance 05/25/2019   Situational mixed anxiety and depressive disorder 02/16/2019   Obesity (BMI 30-39.9) 08/20/2018   Primary insomnia 08/20/2018   OA (osteoarthritis) of right knee, s/p arthroscopy    Seasonal allergies     Past Medical History:  Diagnosis Date   Allergy    environmental and seasonal   Anxiety    DJD (degenerative joint disease), lumbar 06/06/2020   X-rays August 2021   History of chicken pox    as child   HLD (hyperlipidemia)    HSV-1 infection    Hypertension    OA (osteoarthritis) of right knee, s/p arthroscopy    PMB (postmenopausal bleeding)    Seasonal allergies    Skin cancer    chest and back years ago basal cell carcinoma   Tachycardia 12/21/2021   managed by pcp   Tooth size and form abnormality 12/21/2021   1 lower front tooth root dying per pt   Wears glasses for reading     Past Surgical History:  Procedure Laterality Date   BREAST CYST EXCISION Left 10+ yrs ago    CESAREAN SECTION  08/27/1986   colonscopy  2021   CYSTOSCOPY N/A 01/29/2022   Procedure: CYSTOSCOPY;  Surgeon: [name], MD;  Location: [place];  Service: Gynecology;  Laterality: N/A;   DILATATION & CURETTAGE/HYSTEROSCOPY WITH MYOSURE N/A 12/26/2021   Procedure: DILATATION & CURETTAGE/HYSTEROSCOPY WITH MYOSURE;  Surgeon: [name], MD;  Location: [place];  Service: Gynecology;  Laterality: N/A;   DILATION AND CURETTAGE OF UTERUS  1997   Miscarriage   KNEE ARTHROSCOPY Right 02/2015   TONSILLECTOMY  1967   TOTAL LAPAROSCOPIC HYSTERECTOMY WITH BILATERAL SALPINGO OOPHORECTOMY Bilateral 01/29/2022   Procedure: TOTAL LAPAROSCOPIC HYSTERECTOMY WITH BILATERAL SALPINGO OOPHORECTOMY;  Surgeon: [name], MD;  Location: [place];  Service: Gynecology;  Laterality: Bilateral;    Current Outpatient Medications  Medication Sig Dispense Refill   acetaminophen (TYLENOL) 500 MG tablet Take 1,000 mg by mouth every 6 (six) hours as needed.     acetaminophen (TYLENOL) 500 MG tablet Take 2 tablets (1,000 mg total) by mouth every 6 (six) hours as needed. 100 tablet 2   ALPRAZolam (XANAX) 0.5 MG tablet Take 1 tablet (0.5 mg total)  by mouth 2 (two) times daily as needed for anxiety. 30 tablet 0   Biotin 10000 MCG TABS Take by mouth.     cetirizine (ZYRTEC) 10 MG tablet Take 10 mg by mouth daily.     cholecalciferol (VITAMIN D) 1000 units tablet Take 1,000 Units by mouth daily.     Cyanocobalamin (VITAMIN B 12 PO) Take by mouth.     docusate sodium (COLACE) 100 MG capsule Take 1 capsule (100 mg total) by mouth 2 (two) times daily. 60 capsule 2   gabapentin (NEURONTIN) 300 MG capsule Take 1 capsule (300 mg total) by mouth at bedtime. 90 capsule 3   ibuprofen (ADVIL) 800 MG tablet Take 1 tablet (800 mg total) by mouth every 8 (eight) hours as needed. 30 tablet 0   metoprolol succinate (TOPROL-XL) 25 MG 24 hr tablet Take 1 tablet  (25 mg total) by mouth daily. 90 tablet 3   oxyCODONE (OXY IR/ROXICODONE) 5 MG immediate release tablet Take 1 tablet (5 mg total) by mouth every 4 (four) hours as needed for severe pain. (Patient not taking: Reported on 02/05/2022) 30 tablet 0   traZODone (DESYREL) 100 MG tablet Take 1 tablet (100 mg total) by mouth at bedtime. 90 tablet 3   No current facility-administered medications for this visit.     ALLERGIES: Patient has no known allergies.  Family History  Problem Relation Age of Onset   Breast cancer Mother 62       lumpectomy and radiation; estrogen fed   Hypertension Mother    Colon polyps Father    Heart disease Father    Diabetes type I Sister 42   High Cholesterol Sister    Lung cancer Maternal Aunt    Cancer Paternal Uncle    Colon cancer Neg Hx    Esophageal cancer Neg Hx    Stomach cancer Neg Hx    Rectal cancer Neg Hx     Social History   Socioeconomic History   Marital status: Married    Spouse name: Not on file   Number of children: 1   Years of education: Not on file   Highest education level: Not on file  Occupational History   Occupation: Company secretary: [employer]  Tobacco Use   Smoking status: Never    Passive exposure: Past (as a child)   Smokeless tobacco: Never  Vaping Use   Vaping Use: Never used  Substance and Sexual Activity   Alcohol use: Yes    Alcohol/week: 2.0 - 3.0 standard drinks    Types: 2 - 3 Standard drinks or equivalent per week    Comment: 1 drink per day   Drug use: No   Sexual activity: Yes    Partners: Male    Birth control/protection: Post-menopausal  Other Topics Concern   Not on file  Social History Narrative   Husband with failing kidney and not candidate for transplant.   Social Determinants of Health   Financial Resource Strain: Not on file  Food Insecurity: Not on file  Transportation Needs: Not on file  Physical Activity: Not on file  Stress: Not on file  Social  Connections: Not on file  Intimate Partner Violence: Not on file    Review of Systems  All other systems reviewed and are negative.  PHYSICAL EXAMINATION:    LMP 01/06/2014 (Approximate)     General appearance: alert, cooperative and appears stated age Abdomen: soft, non-tender; non distended, no masses,  no organomegaly Incisions: healing well  Pelvic: External genitalia:  no lesions              Urethra:  normal appearing urethra with no masses, tenderness or lesions              Bartholins and Skenes: normal                 Vagina: normal appearing vagina with normal color and discharge, no lesions. Vaginal cuff is healing well.               Cervix: absent              Bimanual Exam:  Uterus:  uterus absent              Adnexa: no mass, fullness, tenderness               Chaperone was present for exam.  1. S/P laparoscopic hysterectomy Doing well Routine f/u

## 2022-03-13 ENCOUNTER — Ambulatory Visit: Payer: BC Managed Care – PPO | Admitting: Family Medicine

## 2022-03-14 ENCOUNTER — Ambulatory Visit (INDEPENDENT_AMBULATORY_CARE_PROVIDER_SITE_OTHER): Payer: BC Managed Care – PPO | Admitting: Family Medicine

## 2022-03-14 ENCOUNTER — Encounter: Payer: Self-pay | Admitting: Family Medicine

## 2022-03-14 VITALS — BP 130/84 | HR 101 | Temp 97.7°F | Ht 62.0 in | Wt 193.0 lb

## 2022-03-14 DIAGNOSIS — Z23 Encounter for immunization: Secondary | ICD-10-CM | POA: Diagnosis not present

## 2022-03-14 DIAGNOSIS — F4323 Adjustment disorder with mixed anxiety and depressed mood: Secondary | ICD-10-CM | POA: Diagnosis not present

## 2022-03-14 DIAGNOSIS — I10 Essential (primary) hypertension: Secondary | ICD-10-CM

## 2022-03-14 DIAGNOSIS — F5101 Primary insomnia: Secondary | ICD-10-CM | POA: Diagnosis not present

## 2022-03-14 DIAGNOSIS — M545 Low back pain, unspecified: Secondary | ICD-10-CM

## 2022-03-14 MED ORDER — CYCLOBENZAPRINE HCL 10 MG PO TABS: 10.0000 mg | ORAL_TABLET | Freq: Every evening | ORAL | 0 refills | Status: DC | PRN

## 2022-03-14 NOTE — Patient Instructions (Signed)
Please return in 6 months for your annual complete physical; please come fasting.   If you have any questions or concerns, please don't hesitate to send me a message via MyChart or call the office at [phone]. Thank you for visiting with us today! It's our pleasure caring for you.  

## 2022-03-14 NOTE — Progress Notes (Signed)
Subjective  CC:  Chief Complaint  Patient presents with   Hypertension    Pt here to F/U with BP and 2nd shringrx vac. Pt will shedule her mammo appt    HPI: Jeanne Parks is a 61 y.o. female who presents to the office today to address the problems listed above in the chief complaint. Hypertension f/u: Control is good . Pt reports she is doing well. taking medications as instructed, no medication side effects noted, no TIAs, no chest pain on exertion, no dyspnea on exertion, no swelling of ankles. Takes metoprolol xl 25 daily. She denies adverse effects from his BP medications. Compliance with medication is good.  Stress reaction: Doing well.  Husband continues to struggle with health problems including heart disease and kidney failure on dialysis.  He is holding steady.  Patient reports she is as well.  Rare Xanax use.  No need for chronic daily medicines. Low back pain: Has chronic radiculopathy on gabapentin which she says is fairly stable on the right.  However has acute low back pain on the left.  No radicular symptoms.  Complains of feeling stiff and tight in the morning.  Can feel a palpable knot.  Some muscle spasms.  No injury or trauma.  No bowel or bladder problems.  Sometimes massage is helpful. Insomnia is well controlled on trazodone.  No adverse effects. Health maintenance: Due for second shingles vaccine and mammogram.  Assessment  1. Essential hypertension   2. Primary insomnia   3. Situational mixed anxiety and depressive disorder   4. Need for shingles vaccine   5. Acute right-sided low back pain without sciatica      Plan   Hypertension f/u: BP control is well controlled.  Continue metoprolol XL 25 daily. Insomnia: Well-controlled on trazodone nightly as needed. Situational anxiety: Doing fairly well coping with home stressors and husband illness.  We will continue to monitor. Right-sided low back pain: Musculoskeletal.  Muscle relaxer at night for muscle spasms.   Stretching heat and massage recommended.  No red flag symptoms present today.  Continue gabapentin for chronic back pain. Shingles vaccine updated today.  Patient to schedule mammogram at breast center.  Education regarding management of these chronic disease states was given. Management strategies discussed on successive visits include dietary and exercise recommendations, goals of achieving and maintaining IBW, and lifestyle modifications aiming for adequate sleep and minimizing stressors.   Follow up: Return in about 6 months (around 09/13/2022) for complete physical.  Orders Placed This Encounter  Procedures   Varicella-zoster vaccine IM (Shingrix)   Meds ordered this encounter  Medications   cyclobenzaprine (FLEXERIL) 10 MG tablet    Sig: Take 1 tablet (10 mg total) by mouth at bedtime as needed for muscle spasms.    Dispense:  30 tablet    Refill:  0      BP Readings from Last 3 Encounters:  03/14/22 130/84  02/27/22 122/82  02/05/22 124/84   Wt Readings from Last 3 Encounters:  03/14/22 193 lb (87.5 kg)  02/27/22 189 lb (85.7 kg)  01/29/22 190 lb (86.2 kg)    Lab Results  Component Value Date   CHOL 212 (H) 08/01/2021   CHOL 200 (H) 06/02/2020   CHOL 204 (H) 05/25/2019   Lab Results  Component Value Date   HDL 58.90 08/01/2021   HDL 60 06/02/2020   HDL 52.50 05/25/2019   Lab Results  Component Value Date   LDLCALC 130 (H) 08/01/2021   LDLCALC 124 (  H) 06/02/2020   LDLCALC 136 (H) 05/25/2019   Lab Results  Component Value Date   TRIG 114.0 08/01/2021   TRIG 70 06/02/2020   TRIG 75.0 05/25/2019   Lab Results  Component Value Date   CHOLHDL 4 08/01/2021   CHOLHDL 3.3 06/02/2020   CHOLHDL 4 05/25/2019   No results found for: LDLDIRECT Lab Results  Component Value Date   CREATININE 1.10 (H) 01/25/2022   BUN 17 01/25/2022   NA 140 01/25/2022   K 4.4 01/25/2022   CL 108 01/25/2022   CO2 27 01/25/2022    The 10-year ASCVD risk score (Arnett DK, et  al., 2019) is: 4.6%   Values used to calculate the score:     Age: 30 years     Sex: Female     Is Non-Hispanic African American: No     Diabetic: No     Tobacco smoker: No     Systolic Blood Pressure: 482 mmHg     Is BP treated: Yes     HDL Cholesterol: 58.9 mg/dL     Total Cholesterol: 212 mg/dL  I reviewed the patients updated PMH, FH, and SocHx.    Patient Active Problem List   Diagnosis Date Noted   S/P laparoscopic hysterectomy 01/29/2022   DJD (degenerative joint disease), lumbar 06/06/2020   Mixed hyperlipidemia 09/28/2019   Essential hypertension 09/28/2019   Sinus tachycardia 07/24/2019   Insulin resistance 05/25/2019   Situational mixed anxiety and depressive disorder 02/16/2019   Obesity (BMI 30-39.9) 08/20/2018   Primary insomnia 08/20/2018   OA (osteoarthritis) of right knee, s/p arthroscopy    Seasonal allergies     Allergies: Patient has no known allergies.  Social History: Patient  reports that she has never smoked. She has been exposed to tobacco smoke. She has never used smokeless tobacco. She reports current alcohol use of about 2.0 - 3.0 standard drinks per week. She reports that she does not use drugs.  Current Meds  Medication Sig   cyclobenzaprine (FLEXERIL) 10 MG tablet Take 1 tablet (10 mg total) by mouth at bedtime as needed for muscle spasms.    Review of Systems: Cardiovascular: negative for chest pain, palpitations, leg swelling, orthopnea Respiratory: negative for SOB, wheezing or persistent cough Gastrointestinal: negative for abdominal pain Genitourinary: negative for dysuria or gross hematuria  Objective  Vitals: BP 130/84   Pulse (!) 101   Temp 97.7 F (36.5 C)   Ht '5\' 2"'$  (1.575 m)   Wt 193 lb (87.5 kg)   LMP 01/06/2014 (Approximate)   SpO2 97%   BMI 35.30 kg/m  General: no acute distress  Psych:  Alert and oriented, normal mood and affect HEENT:  Normocephalic, atraumatic, supple neck  Cardiovascular:  RRR without murmur.  no edema Respiratory:  Good breath sounds bilaterally, CTAB with normal respiratory effort Skin:  Warm, no rashes Back: Left paravertebral muscle spasm present.  Full range of motion.  Negative straight leg raise bilaterally.  Normal gait Neurologic:   Mental status is normal Commons side effects, risks, benefits, and alternatives for medications and treatment plan prescribed today were discussed, and the patient expressed understanding of the given instructions. Patient is instructed to call or message via MyChart if he/she has any questions or concerns regarding our treatment plan. No barriers to understanding were identified. We discussed Red Flag symptoms and signs in detail. Patient expressed understanding regarding what to do in case of urgent or emergency type symptoms.  Medication list was reconciled, printed and  provided to the patient in AVS. Patient instructions and summary information was reviewed with the patient as documented in the AVS. This note was prepared with assistance of Dragon voice recognition software. Occasional wrong-word or sound-a-like substitutions may have occurred due to the inherent limitations of voice recognition software  This visit occurred during the SARS-CoV-2 public health emergency.  Safety protocols were in place, including screening questions prior to the visit, additional usage of staff PPE, and extensive cleaning of exam room while observing appropriate contact time as indicated for disinfecting solutions.

## 2022-07-06 ENCOUNTER — Telehealth: Payer: Self-pay | Admitting: Family Medicine

## 2022-07-06 MED ORDER — TRAMADOL HCL 50 MG PO TABS: 50.0000 mg | ORAL_TABLET | Freq: Four times a day (QID) | ORAL | 0 refills | Status: DC | PRN

## 2022-07-06 NOTE — Telephone Encounter (Signed)
Caller states: -[place] the beach. -requests one fill of prescription to be sent to pharmacy listed below. -upon return from beach, patient will refill through usual pharmacy ([place])   [place]:   03/14/22 OV with PCP   NEXT APPOINTMENT DATE: 09/12/22 CPE   MEDICATION: traMADol (ULTRAM) 50 MG tablet [[id number]]  DISCONTINUED   Is the patient out of medication?  Yes, ran out on 07/04/22   PHARMACY: [facility] at  [facility] [address] [place] [address] [phone]

## 2022-07-17 ENCOUNTER — Other Ambulatory Visit: Payer: Self-pay | Admitting: Family Medicine

## 2022-07-30 ENCOUNTER — Other Ambulatory Visit: Payer: Self-pay | Admitting: Family Medicine

## 2022-07-30 DIAGNOSIS — F5101 Primary insomnia: Secondary | ICD-10-CM

## 2022-09-12 ENCOUNTER — Encounter: Payer: Self-pay | Admitting: Family Medicine

## 2022-09-12 ENCOUNTER — Ambulatory Visit (INDEPENDENT_AMBULATORY_CARE_PROVIDER_SITE_OTHER): Payer: BC Managed Care – PPO | Admitting: Family Medicine

## 2022-09-12 VITALS — BP 138/88 | HR 96 | Temp 97.7°F | Ht 62.0 in | Wt 198.0 lb

## 2022-09-12 DIAGNOSIS — Z0000 Encounter for general adult medical examination without abnormal findings: Secondary | ICD-10-CM

## 2022-09-12 DIAGNOSIS — Z90710 Acquired absence of both cervix and uterus: Secondary | ICD-10-CM

## 2022-09-12 DIAGNOSIS — I10 Essential (primary) hypertension: Secondary | ICD-10-CM | POA: Diagnosis not present

## 2022-09-12 DIAGNOSIS — F4323 Adjustment disorder with mixed anxiety and depressed mood: Secondary | ICD-10-CM | POA: Diagnosis not present

## 2022-09-12 DIAGNOSIS — E88819 Insulin resistance, unspecified: Secondary | ICD-10-CM

## 2022-09-12 DIAGNOSIS — Z1231 Encounter for screening mammogram for malignant neoplasm of breast: Secondary | ICD-10-CM | POA: Diagnosis not present

## 2022-09-12 DIAGNOSIS — M47816 Spondylosis without myelopathy or radiculopathy, lumbar region: Secondary | ICD-10-CM

## 2022-09-12 DIAGNOSIS — R000 Tachycardia, unspecified: Secondary | ICD-10-CM

## 2022-09-12 DIAGNOSIS — E669 Obesity, unspecified: Secondary | ICD-10-CM

## 2022-09-12 DIAGNOSIS — F5101 Primary insomnia: Secondary | ICD-10-CM

## 2022-09-12 DIAGNOSIS — N8502 Endometrial intraepithelial neoplasia [EIN]: Secondary | ICD-10-CM

## 2022-09-12 LAB — CBC WITH DIFFERENTIAL/PLATELET
Basophils Absolute: 0 10*3/uL (ref 0.0–0.1)
Basophils Relative: 0.6 % (ref 0.0–3.0)
Eosinophils Absolute: 0.1 10*3/uL (ref 0.0–0.7)
Eosinophils Relative: 0.8 % (ref 0.0–5.0)
HCT: 38.8 % (ref 36.0–46.0)
Hemoglobin: 12.9 g/dL (ref 12.0–15.0)
Lymphocytes Relative: 17.6 % (ref 12.0–46.0)
Lymphs Abs: 1.3 10*3/uL (ref 0.7–4.0)
MCHC: 33.3 g/dL (ref 30.0–36.0)
MCV: 92.7 fl (ref 78.0–100.0)
Monocytes Absolute: 0.6 10*3/uL (ref 0.1–1.0)
Monocytes Relative: 8.8 % (ref 3.0–12.0)
Neutro Abs: 5.2 10*3/uL (ref 1.4–7.7)
Neutrophils Relative %: 72.2 % (ref 43.0–77.0)
Platelets: 285 10*3/uL (ref 150.0–400.0)
RBC: 4.19 Mil/uL (ref 3.87–5.11)
RDW: 13.8 % (ref 11.5–15.5)
WBC: 7.3 10*3/uL (ref 4.0–10.5)

## 2022-09-12 LAB — COMPREHENSIVE METABOLIC PANEL
ALT: 17 U/L (ref 0–35)
AST: 18 U/L (ref 0–37)
Albumin: 4.2 g/dL (ref 3.5–5.2)
Alkaline Phosphatase: 107 U/L (ref 39–117)
BUN: 16 mg/dL (ref 6–23)
CO2: 31 mEq/L (ref 19–32)
Calcium: 9.3 mg/dL (ref 8.4–10.5)
Chloride: 103 mEq/L (ref 96–112)
Creatinine, Ser: 1.04 mg/dL (ref 0.40–1.20)
GFR: 58.36 mL/min — ABNORMAL LOW (ref >60.00)
Glucose, Bld: 102 mg/dL — ABNORMAL HIGH (ref 70–99)
Potassium: 4 mEq/L (ref 3.5–5.1)
Sodium: 139 mEq/L (ref 135–145)
Total Bilirubin: 0.5 mg/dL (ref 0.2–1.2)
Total Protein: 6.6 g/dL (ref 6.0–8.3)

## 2022-09-12 LAB — HEMOGLOBIN A1C: Hgb A1c MFr Bld: 6.1 % (ref 4.6–6.5)

## 2022-09-12 LAB — LIPID PANEL
Cholesterol: 205 mg/dL — ABNORMAL HIGH (ref 0–200)
HDL: 61 mg/dL (ref >39.00)
LDL Cholesterol: 126 mg/dL — ABNORMAL HIGH (ref 0–99)
NonHDL: 144.32
Total CHOL/HDL Ratio: 3
Triglycerides: 93 mg/dL (ref 0.0–149.0)
VLDL: 18.6 mg/dL (ref 0.0–40.0)

## 2022-09-12 MED ORDER — METOPROLOL SUCCINATE ER 50 MG PO TB24: 50.0000 mg | ORAL_TABLET | Freq: Every day | ORAL | 3 refills | Status: DC

## 2022-09-12 MED ORDER — TRAZODONE HCL 100 MG PO TABS: 100.0000 mg | ORAL_TABLET | Freq: Every day | ORAL | 3 refills | Status: DC

## 2022-09-12 NOTE — Progress Notes (Signed)
Subjective  Chief Complaint  Patient presents with   Annual Exam    Pt here for Annual exam and is currently fasting     HPI: Jeanne Parks is a 61 y.o. female who presents to [facility] today for a Female Wellness Visit. She also has the concerns and/or needs as listed above in the chief complaint. These will be addressed in addition to the Health Maintenance Visit.   Wellness Visit: annual visit with health maintenance review and exam without Pap  HM: s/p hysterectomy for EIN. Reviewed notes. Path was negative and has recovered well. Overdue for mammogram. CRC screening is current. Works full time; husband has stabilized with dialysis. Work is stressful. But overall doing well. Would like to lose weight: too busy for much exercise at the moment and meat / potato diet  Chronic disease f/u and/or acute problem visit: (deemed necessary to be done in addition to the wellness visit): HTN and sinus tach: on low dose metoprolol. Feels well. Bp is typically a little lower than in office today. No palpitations. No cp or sob.  IR: no sxs of hyperglycemia Mood: coping well with stressors. No mood problems at the moment Lbp: intermittent flares managed with muscle relaxers and tramadol. Rare use Insomnia remains well controlled on trazodone nightly. Due refill   Assessment  1. Annual physical exam   2. Essential hypertension   3. Insulin resistance   4. Situational mixed anxiety and depressive disorder   5. Primary insomnia   6. Spondylosis of lumbar region without myelopathy or radiculopathy   7. Encounter for screening mammogram for breast cancer   8. Sinus tachycardia   9. Endometrial intraepithelial neoplasia (EIN)   10. S/P laparoscopic hysterectomy   11. Obesity (BMI 30-39.9)      Plan  Female Wellness Visit: Age appropriate Health Maintenance and Prevention measures were discussed with patient. Included topics are cancer screening recommendations, ways  to keep healthy (see AVS) including dietary and exercise recommendations, regular eye and dental care, use of seat belts, and avoidance of moderate alcohol use and tobacco use. Pt to schedule her mammogram.  BMI: discussed patient's BMI and encouraged positive lifestyle modifications to help get to or maintain a target BMI. HM needs and immunizations were addressed and ordered. See below for orders. See HM and immunization section for updates. Routine labs and screening tests ordered including cmp, cbc and lipids where appropriate. Discussed recommendations regarding Vit D and calcium supplementation (see AVS)  Chronic disease management visit and/or acute problem visit: HTN: fair control. Will increase metoprolol xl to '50mg'$  daily. Check renal function and electrolytes. Continue trazodone 50 qhs for sleep. Well controlled insomnia Discussed dietary changes recommended for weight loss and exercise. Also will help manage stressorrs EIN s/p hysterectomy Lumbar DJD: rec back/core exercises and walking. Prn meds with flares (tramadol 50 and flexeril 10) Recheck A1c for h/o IR.   Follow up: 6 mo for htn  Orders Placed This Encounter  Procedures   MM DIGITAL SCREENING BILATERAL   CBC with Differential/Platelet   Comprehensive metabolic panel   Hemoglobin A1c   Lipid panel   Meds ordered this encounter  Medications   traZODone (DESYREL) 100 MG tablet    Sig: Take 1 tablet (100 mg total) by mouth at bedtime.    Dispense:  90 tablet    Refill:  3    Please hold for next due refill   metoprolol succinate (TOPROL-XL) 50 MG 24 hr tablet  Sig: Take 1 tablet (50 mg total) by mouth daily.    Dispense:  90 tablet    Refill:  3      Body mass index is 36.21 kg/m. Wt Readings from Last 3 Encounters:  09/12/22 198 lb (89.8 kg)  03/14/22 193 lb (87.5 kg)  02/27/22 189 lb (85.7 kg)     Patient Active Problem List   Diagnosis Date Noted   Endometrial intraepithelial neoplasia (EIN)  09/12/2022   S/P laparoscopic hysterectomy 01/29/2022   DJD (degenerative joint disease), lumbar 06/06/2020    X-rays August 2021    Essential hypertension 09/28/2019   Sinus tachycardia 07/24/2019   Insulin resistance 05/25/2019   Situational mixed anxiety and depressive disorder 02/16/2019   Obesity (BMI 30-39.9) 08/20/2018   Primary insomnia 08/20/2018   OA (osteoarthritis) of right knee, s/p arthroscopy    Seasonal allergies    Health Maintenance  Topic Date Due   MAMMOGRAM  01/26/2021   COVID-19 Vaccine (4 - 2023-24 season) 09/28/2022 (Originally 06/08/2022)   DTaP/Tdap/Td (2 - Td or Tdap) 10/31/2026   PAP SMEAR-Modifier  11/30/2026   COLONOSCOPY (Pts 45-35yr Insurance coverage will need to be confirmed)  05/02/2031   Zoster Vaccines- Shingrix  Completed   HPV VACCINES  Aged Out   INFLUENZA VACCINE  Discontinued   Immunization History  Administered Date(s) Administered   Influenza,inj,Quad PF,6+ Mos 08/20/2018, 08/01/2021   PFIZER(Purple Top)SARS-COV-2 Vaccination 01/23/2020, 02/15/2020, 10/26/2020   Pneumococcal Polysaccharide-23 05/25/2019   Tdap 10/31/2016   Zoster Recombinat (Shingrix) 08/01/2021, 03/14/2022   We updated and reviewed the patient's past history in detail and it is documented below. Allergies: Patient has No Known Allergies. Past Medical History Patient  has a past medical history of Allergy, Anxiety, DJD (degenerative joint disease), lumbar (06/06/2020), History of chicken pox, HLD (hyperlipidemia), HSV-1 infection, Hypertension, OA (osteoarthritis) of right knee, s/p arthroscopy, PMB (postmenopausal bleeding), Seasonal allergies, Skin cancer, Tachycardia (12/21/2021), Tooth size and form abnormality (12/21/2021), and Wears glasses for reading. Past Surgical History Patient  has a past surgical history that includes Cesarean section (08/27/1986); Dilation and curettage of uterus (1997); Knee arthroscopy (Right, 02/2015); Tonsillectomy (1967); Breast cyst  excision (Left, 10+ yrs ago); colonscopy (2021); Dilatation & curettage/hysteroscopy with myosure (N/A, 12/26/2021); Total laparoscopic hysterectomy with bilateral salpingo oophorectomy (Bilateral, 01/29/2022); and Cystoscopy (N/A, 01/29/2022). Family History: Patient family history includes Breast cancer (age of onset: 553 in her mother; Cancer in her paternal uncle; Colon polyps in her father; Diabetes type I (age of onset: 131 in her sister; Heart disease in her father; High Cholesterol in her sister; Hypertension in her mother; Lung cancer in her maternal aunt. Social History:  Patient  reports that she has never smoked. She has been exposed to tobacco smoke. She has never used smokeless tobacco. She reports current alcohol use of about 2.0 - 3.0 standard drinks of alcohol per week. She reports that she does not use drugs.  Review of Systems: Constitutional: negative for fever or malaise Ophthalmic: negative for photophobia, double vision or loss of vision Cardiovascular: negative for chest pain, dyspnea on exertion, or new LE swelling Respiratory: negative for SOB or persistent cough Gastrointestinal: negative for abdominal pain, change in bowel habits or melena Genitourinary: negative for dysuria or gross hematuria, no abnormal uterine bleeding or disharge Musculoskeletal: negative for new gait disturbance or muscular weakness Integumentary: negative for new or persistent rashes, no breast lumps Neurological: negative for TIA or stroke symptoms Psychiatric: negative for SI or delusions Allergic/Immunologic: negative for hives  Patient Care Team    Relationship Specialty Notifications Start End  Leamon Arnt, MD PCP - General Family Medicine  07/24/19   [name], MD Consulting Physician Obstetrics and Gynecology  07/25/18   Jacquelynn Cree, PT Physical Therapist Physical Therapy  06/20/20     Objective  Vitals: BP 138/88   Pulse 96   Temp 97.7 F (36.5 C)   Ht '5\' 2"'$   (1.575 m)   Wt 198 lb (89.8 kg)   LMP 01/06/2014 (Approximate)   SpO2 97%   BMI 36.21 kg/m  General:  Well developed, well nourished, no acute distress  Psych:  Alert and orientedx3,normal mood and affect HEENT:  Normocephalic, atraumatic, non-icteric sclera,  supple neck without adenopathy, mass or thyromegaly Cardiovascular:  Normal S1, S2, RRR without gallop, rub or murmur Respiratory:  Good breath sounds bilaterally, CTAB with normal respiratory effort Gastrointestinal: normal bowel sounds, soft, non-tender, no noted masses. No HSM MSK: no deformities, contusions. Joints are without erythema or swelling.  Skin:  Warm, no rashes or suspicious lesions noted Neurologic:    Mental status is normal. CN 2-11 are normal. Gross motor and sensory exams are normal. Normal gait. No tremor   Commons side effects, risks, benefits, and alternatives for medications and treatment plan prescribed today were discussed, and the patient expressed understanding of the given instructions. Patient is instructed to call or message via MyChart if he/she has any questions or concerns regarding our treatment plan. No barriers to understanding were identified. We discussed Red Flag symptoms and signs in detail. Patient expressed understanding regarding what to do in case of urgent or emergency type symptoms.  Medication list was reconciled, printed and provided to the patient in AVS. Patient instructions and summary information was reviewed with the patient as documented in the AVS. This note was prepared with assistance of Dragon voice recognition software. Occasional wrong-word or sound-a-like substitutions may have occurred due to the inherent limitations of voice recognition software

## 2022-09-12 NOTE — Patient Instructions (Signed)
Please return in 6 months for hypertension follow up.   I will release your lab results to you on your MyChart account with further instructions. You may see the results before I do, but when I review them I will send you a message with my report or have my assistant call you if things need to be discussed. Please reply to my message with any questions. Thank you!   If you have any questions or concerns, please don't hesitate to send me a message via MyChart or call the office at [phone]. Thank you for visiting with [place] today! It's our pleasure caring for you.   I have ordered a mammogram and/or bone density for you as we discussed today: '[x]'$   Mammogram  '[]'$   Bone Density  Please call the office checked below to schedule your appointment:  '[x]'$   [facility]      [address] [place], [place]         '[]'$   [facility]  [address] [place], [place]

## 2022-09-28 ENCOUNTER — Telehealth: Payer: Self-pay | Admitting: Family Medicine

## 2022-09-28 MED ORDER — NIRMATRELVIR/RITONAVIR (PAXLOVID)TABLET: 3.0000 | ORAL_TABLET | Freq: Two times a day (BID) | ORAL | 0 refills | Status: AC

## 2022-09-28 NOTE — Telephone Encounter (Signed)
Reviewed chart.  Covid +.  Sent message and meds.

## 2022-09-28 NOTE — Telephone Encounter (Signed)
Pt is Covid positive and would like to know if there is anything that she can take for her symptoms of cough and congestion. Please advise.

## 2022-10-23 ENCOUNTER — Other Ambulatory Visit: Payer: Self-pay | Admitting: Family Medicine

## 2022-11-07 ENCOUNTER — Ambulatory Visit
Admission: RE | Admit: 2022-11-07 | Discharge: 2022-11-07 | Disposition: A | Discharge status: Home / Self Care | Payer: BC Managed Care – PPO | Source: Ambulatory Visit | Attending: Family Medicine | Admitting: Family Medicine

## 2022-11-07 DIAGNOSIS — Z1231 Encounter for screening mammogram for malignant neoplasm of breast: Secondary | ICD-10-CM

## 2023-01-15 ENCOUNTER — Other Ambulatory Visit: Payer: Self-pay | Admitting: Family Medicine

## 2023-01-15 NOTE — Telephone Encounter (Signed)
Last refill: 07/18/22 #30, 5 Last OV: 09/12/22 dx. CPE

## 2023-03-14 ENCOUNTER — Encounter: Payer: Self-pay | Admitting: Family Medicine

## 2023-03-14 ENCOUNTER — Ambulatory Visit (INDEPENDENT_AMBULATORY_CARE_PROVIDER_SITE_OTHER): Payer: BC Managed Care – PPO | Admitting: Family Medicine

## 2023-03-14 VITALS — BP 118/78 | HR 80 | Temp 98.7°F | Ht 62.0 in | Wt 193.0 lb

## 2023-03-14 DIAGNOSIS — F5101 Primary insomnia: Secondary | ICD-10-CM

## 2023-03-14 DIAGNOSIS — F4322 Adjustment disorder with anxiety: Secondary | ICD-10-CM

## 2023-03-14 DIAGNOSIS — I10 Essential (primary) hypertension: Secondary | ICD-10-CM | POA: Diagnosis not present

## 2023-03-14 DIAGNOSIS — E88819 Insulin resistance, unspecified: Secondary | ICD-10-CM

## 2023-03-14 MED ORDER — ESCITALOPRAM OXALATE 10 MG PO TABS: 10.0000 mg | ORAL_TABLET | Freq: Every day | ORAL | 2 refills | Status: DC

## 2023-03-14 NOTE — Progress Notes (Signed)
Subjective  CC:  Chief Complaint  Patient presents with   Hypertension   Anxiety   Insomnia    HPI: Jeanne Parks is a 62 y.o. female who presents to the office today to address the problems listed above in the chief complaint. Hypertension f/u: Control is good . Pt reports she is doing well. taking medications as instructed, no medication side effects noted, no TIAs, no chest pain on exertion, no dyspnea on exertion, no swelling of ankles. She denies adverse effects from his BP medications. Compliance with medication is good.  Insomnia remains well-controlled on trazodone.  No adverse effects Hyperglycemia: Diet is good.  3 meals daily.  Avoid sweets.  No symptoms of hypoglycemia.  Weight is stable Stress: Life is very stressful, works full-time, husband has end-stage renal disease on dialysis.  Rarely uses Xanax but has noted that she needs a little bit more frequently.  Having a bit more trouble concentrating at work due to managing complications from her husband's medical problems.  No depression but definitely feels like she is doing worse from the stress.  No panic symptoms.  Assessment  1. Essential hypertension   2. Insulin resistance   3. Primary insomnia   4. Adjustment reaction with anxiety      Plan   Hypertension f/u: BP control is well controlled.  Continue beta-blocker Continue healthy diet Continue trazodone for sleep Long discussion regarding stress reaction anxiety.  Discussed management strategies.  Elect Lexapro 10 daily.  Appropriate counseling education and use discussed.  Recheck 6 weeks  Education regarding management of these chronic disease states was given. Management strategies discussed on successive visits include dietary and exercise recommendations, goals of achieving and maintaining IBW, and lifestyle modifications aiming for adequate sleep and minimizing stressors.   Follow up: 6 weeks to recheck mood  No orders of the defined types were placed  in this encounter.  Meds ordered this encounter  Medications   escitalopram (LEXAPRO) 10 MG tablet    Sig: Take 1 tablet (10 mg total) by mouth daily.    Dispense:  30 tablet    Refill:  2      BP Readings from Last 3 Encounters:  03/14/23 118/78  09/12/22 138/88  03/14/22 130/84   Wt Readings from Last 3 Encounters:  03/14/23 193 lb (87.5 kg)  09/12/22 198 lb (89.8 kg)  03/14/22 193 lb (87.5 kg)    Lab Results  Component Value Date   CHOL 205 (H) 09/12/2022   CHOL 212 (H) 08/01/2021   CHOL 200 (H) 06/02/2020   Lab Results  Component Value Date   HDL 61.00 09/12/2022   HDL 58.90 08/01/2021   HDL 60 06/02/2020   Lab Results  Component Value Date   LDLCALC 126 (H) 09/12/2022   LDLCALC 130 (H) 08/01/2021   LDLCALC 124 (H) 06/02/2020   Lab Results  Component Value Date   TRIG 93.0 09/12/2022   TRIG 114.0 08/01/2021   TRIG 70 06/02/2020   Lab Results  Component Value Date   CHOLHDL 3 09/12/2022   CHOLHDL 4 08/01/2021   CHOLHDL 3.3 06/02/2020   No results found for: "LDLDIRECT" Lab Results  Component Value Date   CREATININE 1.04 09/12/2022   BUN 16 09/12/2022   NA 139 09/12/2022   K 4.0 09/12/2022   CL 103 09/12/2022   CO2 31 09/12/2022    The 10-year ASCVD risk score (Arnett DK, et al., 2019) is: 4%   Values used to calculate the score:  Age: 74 years     Sex: Female     Is Non-Hispanic African American: No     Diabetic: No     Tobacco smoker: No     Systolic Blood Pressure: 118 mmHg     Is BP treated: Yes     HDL Cholesterol: 61 mg/dL     Total Cholesterol: 205 mg/dL  I reviewed the patients updated PMH, FH, and SocHx.    Patient Active Problem List   Diagnosis Date Noted   Endometrial intraepithelial neoplasia (EIN) 09/12/2022   S/P laparoscopic hysterectomy 01/29/2022   DJD (degenerative joint disease), lumbar 06/06/2020   Essential hypertension 09/28/2019   Sinus tachycardia 07/24/2019   Insulin resistance 05/25/2019   Situational  mixed anxiety and depressive disorder 02/16/2019   Obesity (BMI 30-39.9) 08/20/2018   Primary insomnia 08/20/2018   OA (osteoarthritis) of right knee, s/p arthroscopy    Seasonal allergies     Allergies: Patient has no known allergies.  Social History: Patient  reports that she has never smoked. She has been exposed to tobacco smoke. She has never used smokeless tobacco. She reports current alcohol use of about 2.0 - 3.0 standard drinks of alcohol per week. She reports that she does not use drugs.  Current Meds  Medication Sig   ALPRAZolam (XANAX) 0.5 MG tablet Take 1 tablet (0.5 mg total) by mouth 2 (two) times daily as needed for anxiety.   Biotin 16109 MCG TABS Take by mouth.   cetirizine (ZYRTEC) 10 MG tablet Take 10 mg by mouth daily.   cholecalciferol (VITAMIN D) 1000 units tablet Take 1,000 Units by mouth daily.   Cyanocobalamin (VITAMIN B 12 PO) Take by mouth.   cyclobenzaprine (FLEXERIL) 10 MG tablet Take 1 tablet (10 mg total) by mouth at bedtime as needed for muscle spasms.   escitalopram (LEXAPRO) 10 MG tablet Take 1 tablet (10 mg total) by mouth daily.   gabapentin (NEURONTIN) 300 MG capsule TAKE ONE CAPSULE AT BEDTIME   ibuprofen (ADVIL) 800 MG tablet Take 1 tablet (800 mg total) by mouth every 8 (eight) hours as needed.   metoprolol succinate (TOPROL-XL) 50 MG 24 hr tablet Take 1 tablet (50 mg total) by mouth daily.   traMADol (ULTRAM) 50 MG tablet TAKE ONE TABLET (50mg ) BY MOUTH EVERY 6 HOURS AS NEEDED for moderate pain   traZODone (DESYREL) 100 MG tablet Take 1 tablet (100 mg total) by mouth at bedtime.    Review of Systems: Cardiovascular: negative for chest pain, palpitations, leg swelling, orthopnea Respiratory: negative for SOB, wheezing or persistent cough Gastrointestinal: negative for abdominal pain Genitourinary: negative for dysuria or gross hematuria  Objective  Vitals: BP 118/78   Pulse 80   Temp 98.7 F (37.1 C)   Ht 5\' 2"  (1.575 m)   Wt 193 lb  (87.5 kg)   LMP 01/06/2014 (Approximate)   SpO2 97%   BMI 35.30 kg/m  General: no acute distress  Psych:  Alert and oriented, appears stressed, normal mood HEENT:  Normocephalic, atraumatic, supple neck  Cardiovascular:  RRR without murmur. no edema Respiratory:  Good breath sounds bilaterally, CTAB with normal respiratory effort Neurologic:   Mental status is normal Commons side effects, risks, benefits, and alternatives for medications and treatment plan prescribed today were discussed, and the patient expressed understanding of the given instructions. Patient is instructed to call or message via MyChart if he/she has any questions or concerns regarding our treatment plan. No barriers to understanding were identified. We discussed Red  Flag symptoms and signs in detail. Patient expressed understanding regarding what to do in case of urgent or emergency type symptoms.  Medication list was reconciled, printed and provided to the patient in AVS. Patient instructions and summary information was reviewed with the patient as documented in the AVS. This note was prepared with assistance of Dragon voice recognition software. Occasional wrong-word or sound-a-like substitutions may have occurred due to the inherent limitation

## 2023-03-25 NOTE — Progress Notes (Signed)
62 y.o. G25P1011 Married White or Caucasian Not Hispanic or Latino female here for annual exam.   S/P TLH/BSO in 4/23 for endometrial hyperplasia, pathology was benign    Her husband is in the hospital in heart failure. Multiple medical issues. Showing some improvement. They have been married x 41 years.   No bowel or bladder issues.   Patient's last menstrual period was 01/06/2014 (approximate).          Sexually active: No.  The current method of family planning is post menopausal status.    Exercising: No.  The patient does not participate in regular exercise at present. Smoker:  no  Health Maintenance: Pap:  11/30/21 WNL 12/03/18 normal HR HPV Neg, 09/28/15 normal Neg HR HP  History of abnormal Pap:  no MMG:  11/09/22 density C Bi-rads 1 neg  BMD:  Never  Colonoscopy: 04/11/21 F/u 10 years  TDaP:  10/31/16  Gardasil: NA   reports that she has never smoked. She has been exposed to tobacco smoke. She has never used smokeless tobacco. She reports current alcohol use of about 2.0 - 3.0 standard drinks of alcohol per week. She reports that she does not use drugs. Has 1 drink a day. Husband with h/o kidney transplant, DM. She is a Scientist, physiological in an accounting firm.  Her daughter is married, she has 2 grown stepchildren and is trying to get pregnant. They live on the same property as her daughter and family.    Past Medical History:  Diagnosis Date   Allergy    environmental and seasonal   Anxiety    DJD (degenerative joint disease), lumbar 06/06/2020   X-rays August 2021   History of chicken pox    as child   HLD (hyperlipidemia)    HSV-1 infection    Hypertension    OA (osteoarthritis) of right knee, s/p arthroscopy    PMB (postmenopausal bleeding)    Seasonal allergies    Skin cancer    chest and back years ago basal cell carcinoma   Tachycardia 12/21/2021   managed by pcp   Tooth size and form abnormality 12/21/2021   1 lower front tooth root dying per pt   Wears glasses for  reading     Past Surgical History:  Procedure Laterality Date   BREAST CYST EXCISION Left 10+ yrs ago   CESAREAN SECTION  08/27/1986   colonscopy  2021   CYSTOSCOPY N/A 01/29/2022   Procedure: CYSTOSCOPY;  Surgeon: Romualdo Bolk, MD;  Location: [facility];  Service: Gynecology;  Laterality: N/A;   DILATATION & CURETTAGE/HYSTEROSCOPY WITH MYOSURE N/A 12/26/2021   Procedure: DILATATION & CURETTAGE/HYSTEROSCOPY WITH MYOSURE;  Surgeon: Romualdo Bolk, MD;  Location: [facility];  Service: Gynecology;  Laterality: N/A;   DILATION AND CURETTAGE OF UTERUS  1997   Miscarriage   KNEE ARTHROSCOPY Right 02/2015   TONSILLECTOMY  1967   TOTAL LAPAROSCOPIC HYSTERECTOMY WITH BILATERAL SALPINGO OOPHORECTOMY Bilateral 01/29/2022   Procedure: TOTAL LAPAROSCOPIC HYSTERECTOMY WITH BILATERAL SALPINGO OOPHORECTOMY;  Surgeon: Romualdo Bolk, MD;  Location: [facility];  Service: Gynecology;  Laterality: Bilateral;    Current Outpatient Medications  Medication Sig Dispense Refill   ALPRAZolam (XANAX) 0.5 MG tablet Take 1 tablet (0.5 mg total) by mouth 2 (two) times daily as needed for anxiety. 30 tablet 0   Biotin 29562 MCG TABS Take by mouth.     cetirizine (ZYRTEC) 10 MG tablet Take 10 mg by mouth daily.  cholecalciferol (VITAMIN D) 1000 units tablet Take 1,000 Units by mouth daily.     Cyanocobalamin (VITAMIN B 12 PO) Take by mouth.     cyclobenzaprine (FLEXERIL) 10 MG tablet Take 1 tablet (10 mg total) by mouth at bedtime as needed for muscle spasms. 30 tablet 0   escitalopram (LEXAPRO) 10 MG tablet Take 1 tablet (10 mg total) by mouth daily. 30 tablet 2   gabapentin (NEURONTIN) 300 MG capsule TAKE ONE CAPSULE AT BEDTIME 90 capsule 1   ibuprofen (ADVIL) 800 MG tablet Take 1 tablet (800 mg total) by mouth every 8 (eight) hours as needed. 30 tablet 0   metoprolol succinate (TOPROL-XL) 50 MG 24 hr tablet Take 1 tablet (50 mg total) by mouth  daily. 90 tablet 3   traMADol (ULTRAM) 50 MG tablet TAKE ONE TABLET (50mg ) BY MOUTH EVERY 6 HOURS AS NEEDED for moderate pain 30 tablet 5   traZODone (DESYREL) 100 MG tablet Take 1 tablet (100 mg total) by mouth at bedtime. 90 tablet 3   No current facility-administered medications for this visit.    Family History  Problem Relation Age of Onset   Breast cancer Mother 34       lumpectomy and radiation; estrogen fed   Hypertension Mother    Colon polyps Father    Heart disease Father    Diabetes type I Sister 20   High Cholesterol Sister    Lung cancer Maternal Aunt    Cancer Paternal Uncle    Colon cancer Neg Hx    Esophageal cancer Neg Hx    Stomach cancer Neg Hx    Rectal cancer Neg Hx     Review of Systems  All other systems reviewed and are negative.   Exam:   BP 122/76   Pulse 78   Ht 5\' 3"  (1.6 m)   Wt 190 lb (86.2 kg)   LMP 01/06/2014 (Approximate)   SpO2 100%   BMI 33.66 kg/m   Weight change: @WEIGHTCHANGE @ Height:   Height: 5\' 3"  (160 cm)  Ht Readings from Last 3 Encounters:  04/02/23 5\' 3"  (1.6 m)  03/14/23 5\' 2"  (1.575 m)  09/12/22 5\' 2"  (1.575 m)    General appearance: alert, cooperative and appears stated age Head: Normocephalic, without obvious abnormality, atraumatic Neck: no adenopathy, supple, symmetrical, trachea midline and thyroid normal to inspection and palpation Lungs: clear to auscultation bilaterally Cardiovascular: regular rate and rhythm Breasts: normal appearance, no masses or tenderness Abdomen: soft, non-tender; non distended,  no masses,  no organomegaly Extremities: extremities normal, atraumatic, no cyanosis or edema Skin: Skin color, texture, turgor normal. No rashes or lesions Lymph nodes: Cervical, supraclavicular, and axillary nodes normal. No abnormal inguinal nodes palpated Neurologic: Grossly normal   Pelvic: External genitalia:  no lesions              Urethra:  normal appearing urethra with no masses, tenderness or  lesions              Bartholins and Skenes: normal                 Vagina: normal appearing vagina with normal color and discharge, no lesions              Cervix: absent               Bimanual Exam:  Uterus:  uterus absent              Adnexa: no mass, fullness, tenderness  Rectovaginal: Confirms               Anus:  normal sphincter tone, no lesions  Carolynn Serve, CMA chaperoned for the exam.  1. Well woman exam Discussed breast self exam Discussed calcium and vit D intake Mammogram and colonoscopy UTD Labs with primary No pap needed

## 2023-04-02 ENCOUNTER — Encounter: Payer: Self-pay | Admitting: Obstetrics and Gynecology

## 2023-04-02 ENCOUNTER — Ambulatory Visit (INDEPENDENT_AMBULATORY_CARE_PROVIDER_SITE_OTHER): Payer: BC Managed Care – PPO | Admitting: Obstetrics and Gynecology

## 2023-04-02 VITALS — BP 122/76 | HR 78 | Ht 63.0 in | Wt 190.0 lb

## 2023-04-02 DIAGNOSIS — Z01419 Encounter for gynecological examination (general) (routine) without abnormal findings: Secondary | ICD-10-CM

## 2023-04-02 NOTE — Patient Instructions (Signed)

## 2023-04-12 ENCOUNTER — Telehealth: Payer: Self-pay | Admitting: Family Medicine

## 2023-04-12 ENCOUNTER — Other Ambulatory Visit: Payer: Self-pay | Admitting: Family Medicine

## 2023-04-12 DIAGNOSIS — F4323 Adjustment disorder with mixed anxiety and depressed mood: Secondary | ICD-10-CM

## 2023-04-12 MED ORDER — ALPRAZOLAM 0.5 MG PO TABS: 0.5000 mg | ORAL_TABLET | Freq: Two times a day (BID) | ORAL | 0 refills | Status: DC | PRN

## 2023-04-12 NOTE — Telephone Encounter (Signed)
Caller is patient's daughter who is a Teacher, early years/pre at [employer].   Caller states her father/patient's husband passed away yesterday. Per caller, patient is requesting a refill of her xanax 0.5 mg, last filled 07/2021. I informed caller of this and that an OV is needed for new script but PCP is out of office until 7/10. I offered an OV with another provider due to circumstances but caller states pt is unable to come in due to needing to make funeral preparations.   Caller is requesting a couple pills of xanax be sent to [facility] on [address] and pt just follow up with PCP once back in office. Please Advise.

## 2023-04-12 NOTE — Telephone Encounter (Signed)
Called and lm on pt daughter vm with detailed message.

## 2023-04-12 NOTE — Telephone Encounter (Signed)
See below, can you assist in Andy's absence?

## 2023-04-25 ENCOUNTER — Encounter: Payer: Self-pay | Admitting: Family Medicine

## 2023-04-25 ENCOUNTER — Ambulatory Visit (INDEPENDENT_AMBULATORY_CARE_PROVIDER_SITE_OTHER): Payer: BC Managed Care – PPO | Admitting: Family Medicine

## 2023-04-25 VITALS — BP 130/80 | HR 102 | Temp 97.7°F | Ht 63.0 in | Wt 188.8 lb

## 2023-04-25 DIAGNOSIS — Z634 Disappearance and death of family member: Secondary | ICD-10-CM

## 2023-04-25 DIAGNOSIS — F5101 Primary insomnia: Secondary | ICD-10-CM

## 2023-04-25 DIAGNOSIS — F4321 Adjustment disorder with depressed mood: Secondary | ICD-10-CM

## 2023-04-25 DIAGNOSIS — F4323 Adjustment disorder with mixed anxiety and depressed mood: Secondary | ICD-10-CM | POA: Diagnosis not present

## 2023-04-25 MED ORDER — ESCITALOPRAM OXALATE 10 MG PO TABS: 10.0000 mg | ORAL_TABLET | Freq: Every day | ORAL | 3 refills | Status: DC

## 2023-04-25 NOTE — Patient Instructions (Signed)
Please return in November for your complete physical.   If you have any questions or concerns, please don't hesitate to send me a message via MyChart or call the office at [phone]. Thank you for visiting with [place] today! It's our pleasure caring for you.   Managing Loss, Adult People experience loss in many different ways throughout their lives. Events such as moving, changing jobs, and losing friends can create a sense of loss. The loss may be as serious as a major health change, divorce, death of a pet, or death of a loved one. All of these types of loss are likely to create a physical and emotional reaction known as grief. Grief is the result of a major change or an absence of something or someone that you count on. Grief is a normal reaction to loss. A variety of factors can affect your grieving experience, including: The nature of your loss. Your relationship to what or whom you lost. Your understanding of grief and how to manage it. Your support system. Be aware that when grief becomes extreme, it can lead to more severe issues like isolation, depression, anxiety, or suicidal thoughts. Talk with your health care provider if you have any of these issues. How to manage lifestyle changes Keep to your normal routine as much as possible. If you have trouble focusing or doing normal activities, it is acceptable to take some time away from your normal routine. Spend time with friends and loved ones. Eat a healthy diet, get plenty of sleep, and rest when you feel tired. How to recognize changes  The way that you deal with your grief will affect your ability to function as you normally do. When grieving, you may experience these changes: Numbness, shock, sadness, anxiety, anger, denial, and guilt. Thoughts about death. Unexpected crying. A physical sensation of emptiness in your stomach. Problems sleeping and eating. Tiredness (fatigue). Loss of interest in normal activities. Dreaming about  or imagining seeing the person who died. A need to remember what or whom you lost. Difficulty thinking about anything other than your loss for a period of time. Relief. If you have been expecting the loss for a while, you may feel a sense of relief when it happens. Follow these instructions at home: Activity Express your feelings in healthy ways, such as: Talking with others about your loss. It may be helpful to find others who have had a similar loss, such as a support group. Writing down your feelings in a journal. Doing physical activities to release stress and emotional energy. Doing creative activities like painting, sculpting, or playing or listening to music. Practicing resilience. This is the ability to recover and adjust after facing challenges. Reading some resources that encourage resilience may help you to learn ways to practice those behaviors.  General instructions Be patient with yourself and others. Allow the grieving process to happen, and remember that grieving takes time. It is likely that you may never feel completely done with some grief. You may find a way to move on while still cherishing memories and feelings about your loss. Accepting your loss is a process. It can take months or longer to adjust. Keep all follow-up visits. This is important. Where to find support To get support for managing loss: Ask your health care provider for help and recommendations, such as grief counseling or therapy. Think about joining a support group for people who are managing a loss. Where to find more information You can find more information about managing  loss from: American Society of Clinical Oncology: www.cancer.net American Psychological Association: [employer] Contact a health care provider if: Your grief is extreme and keeps getting worse. You have ongoing grief that does not improve. Your body shows symptoms of grief, such as illness. You feel depressed, anxious, or  hopeless. Get help right away if: You have thoughts about hurting yourself or others. Get help right away if you feel like you may hurt yourself or others, or have thoughts about taking your own life. Go to your nearest emergency room or: Call 911. Call the National Suicide Prevention Lifeline at (906) 697-2640 or 988. This is open 24 hours a day. Text the Crisis Text Line at 670 427 5729. Summary Grief is the result of a major change or an absence of someone or something that you count on. Grief is a normal reaction to loss. The depth of grief and the period of recovery depend on the type of loss and your ability to adjust to the change and process your feelings. Processing grief requires patience and a willingness to accept your feelings and talk about your loss with people who are supportive. It is important to find resources that work for you and to realize that people experience grief differently. There is not one grieving process that works for everyone in the same way. Be aware that when grief becomes extreme, it can lead to more severe issues like isolation, depression, anxiety, or suicidal thoughts. Talk with your health care provider if you have any of these issues. This information is not intended to replace advice given to you by your health care provider. Make sure you discuss any questions you have with your health care provider. Document Revised: 05/15/2021 Document Reviewed: 05/15/2021 Elsevier Patient Education  2024 [employer].

## 2023-04-25 NOTE — Progress Notes (Signed)
Subjective  CC:  Chief Complaint  Patient presents with   Anxiety   Depression    HPI: Jeanne Parks is a 62 y.o. female who presents to the office today to address the problems listed above in the chief complaint, mood problems. This was going to be a follow-up visit, 6 weeks after starting Lexapro for situational anxiety and depression.  Unfortunately, 2 weeks ago she lost her husband after prolonged hospital course.  She is grieving appropriately.  Has a good support system, lives near daughter and son-in-law.  She is planning to restart work next week.  She is taking Lexapro and has had no adverse effects.  Clearly cannot evaluate its efficacy at this time.  Uses Xanax once or twice to help her sleep.  She is eating.     04/25/2023    8:28 AM 03/14/2023    9:08 AM 09/12/2022    8:58 AM  Depression screen PHQ 2/9  Decreased Interest 3 0 0  Down, Depressed, Hopeless 2 0 0  PHQ - 2 Score 5 0 0  Altered sleeping 1    Tired, decreased energy 0    Change in appetite 0    Feeling bad or failure about yourself  0    Trouble concentrating 0    Moving slowly or fidgety/restless 0    Suicidal thoughts 0    PHQ-9 Score 6    Difficult doing work/chores Not difficult at all        04/25/2023    8:31 AM 03/14/2023    9:08 AM  GAD 7 : Generalized Anxiety Score  Nervous, Anxious, on Edge 2 0  Control/stop worrying 0 0  Worry too much - different things 2 0  Trouble relaxing 2 0  Restless 0 0  Easily annoyed or irritable 0 0  Afraid - awful might happen 0 0  Total GAD 7 Score 6 0  Anxiety Difficulty Somewhat difficult Not difficult at all    Assessment  1. Situational mixed anxiety and depressive disorder   2. Grief   3. Primary insomnia      Plan  Grief due to loss of spouse: Appropriate counseling done.  Will continue Lexapro 10. Follow up: November for complete physical No orders of the defined types were placed in this encounter.  Meds ordered this encounter   Medications   escitalopram (LEXAPRO) 10 MG tablet    Sig: Take 1 tablet (10 mg total) by mouth daily.    Dispense:  90 tablet    Refill:  3      I reviewed the patients updated PMH, FH, and SocHx.    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   Biotin 32440 MCG TABS Take by mouth.   cetirizine (ZYRTEC) 10 MG tablet Take 10 mg by mouth daily.   cholecalciferol (VITAMIN D) 1000 units tablet Take 1,000 Units by mouth daily.   Cyanocobalamin (VITAMIN B 12 PO) Take by mouth.   cyclobenzaprine (FLEXERIL) 10 MG tablet Take 1 tablet (10 mg total) by mouth at bedtime as needed for muscle spasms.   gabapentin (NEURONTIN) 300 MG capsule TAKE ONE CAPSULE AT BEDTIME   ibuprofen (ADVIL) 800 MG tablet Take 1 tablet (800 mg total) by mouth every 8 (eight) hours as needed.   metoprolol succinate (TOPROL-XL) 50 MG 24 hr tablet Take 1 tablet (50 mg total) by mouth daily.   traMADol (ULTRAM) 50 MG tablet TAKE ONE TABLET (50mg ) BY MOUTH EVERY 6 HOURS AS NEEDED for moderate pain   traZODone (DESYREL) 100 MG tablet Take 1 tablet (100 mg total)  by mouth at bedtime.   [DISCONTINUED] escitalopram (LEXAPRO) 10 MG tablet Take 1 tablet (10 mg total) by mouth daily.    Allergies: Patient has No Known Allergies. Family history:  Patient family history includes Breast cancer (age of onset: 104) in her mother; Cancer in her paternal uncle; Colon polyps in her father; Diabetes type I (age of onset: 60) in her sister; Heart disease in her father; High Cholesterol in her sister; Hypertension in her mother; Lung cancer in her maternal aunt. Social History   Socioeconomic History   Marital status: Widowed    Spouse name: Not on file   Number of children: 1   Years of education: Not on file   Highest education level: 12th grade  Occupational History   Occupation: Lexicographer: [employer]  Tobacco Use   Smoking status: Never    Passive exposure: Past (as a child)   Smokeless tobacco: Never  Vaping Use   Vaping status: Never Used  Substance and Sexual Activity   Alcohol use: Yes    Alcohol/week: 2.0 - 3.0 standard drinks of alcohol    Types: 2 - 3 Standard drinks or equivalent per week    Comment: 1 drink per day   Drug use: No   Sexual activity: Yes    Partners: Male    Birth control/protection: Post-menopausal  Other Topics Concern   Not on file  Social History Narrative   Husband with failing kidney and not candidate for transplant. Passed April 11 2023.   Social Determinants of Health   Financial Resource Strain: Low Risk  (04/24/2023)   Overall Financial Resource Strain (CARDIA)    Difficulty of Paying Living Expenses: Not hard at all  Food Insecurity: No Food Insecurity (04/24/2023)   Hunger Vital Sign    Worried About Running Out of Food in the Last Year: Never true    Ran Out of Food in the Last Year: Never true  Transportation Needs: No Transportation Needs (04/24/2023)   PRAPARE - Administrator, Civil Service (Medical): No    Lack of Transportation (Non-Medical): No   Physical Activity: Insufficiently Active (04/24/2023)   Exercise Vital Sign    Days of Exercise per Week: 2 days    Minutes of Exercise per Session: 10 min  Stress: Stress Concern Present (04/24/2023)   [employer] of Occupational Health -  Occupational Stress Questionnaire    Feeling of Stress : To some extent  Social Connections: Moderately Isolated (04/24/2023)   Social Connection and Isolation Panel [NHANES]    Frequency of Communication with Friends and Family: More than three times a week    Frequency of Social Gatherings with Friends and Family: More than three times a week    Attends Religious Services: 1 to 4 times per year    Active Member of [employer] or Organizations: No    Attends Banker Meetings: Not on file    Marital Status: Widowed     Review of Systems: Constitutional: Negative for fever malaise or anorexia Cardiovascular: negative for chest pain Respiratory: negative for SOB or persistent cough Gastrointestinal: negative for abdominal pain  Objective  Vitals: BP 130/80   Pulse (!) 102   Temp 97.7 F (36.5 C)   Ht 5\' 3"  (1.6 m)   Wt 188 lb 12.8 oz (85.6 kg)   LMP 01/06/2014 (Approximate)   SpO2 95%   BMI 33.44 kg/m  General: no acute distress, well appearing, no apparent distress, well groomed Psych:  Alert and oriented x 3, appropriate affect, tearful.    Commons side effects, risks, benefits, and alternatives for medications and treatment plan prescribed today were discussed, and the patient expressed understanding of the given instructions. Patient is instructed to call or message via MyChart if he/she has any questions or concerns regarding our treatment plan. No barriers to understanding were identified. We discussed Red Flag symptoms and signs in detail. Patient expressed understanding regarding what to do in case of urgent or emergency type symptoms.  Medication list was reconciled, printed and provided to the patient in AVS. Patient  instructions and summary information was reviewed with the patient as documented in the AVS. This note was prepared with assistance of Dragon voice recognition software. Occasional wrong-word or sound-a-like substitutions may have occurred due to the inherent limitations of voice recognition software

## 2023-05-02 ENCOUNTER — Other Ambulatory Visit: Payer: Self-pay | Admitting: Family Medicine

## 2023-07-26 ENCOUNTER — Other Ambulatory Visit: Payer: Self-pay | Admitting: Family Medicine

## 2023-08-16 ENCOUNTER — Telehealth: Payer: Self-pay | Admitting: Urology

## 2023-08-16 MED ORDER — AZITHROMYCIN 250 MG PO TABS: ORAL_TABLET | ORAL | 0 refills | Status: DC

## 2023-08-16 NOTE — Telephone Encounter (Signed)
I spoke with the patient whom is a close family friend, regarding some progressive URI symptoms she has been dealing with for the past 7-10 days. She reports that this began with some chest congestion, nasal congestion and itchy throat. She tried OTC Allegra, mucinex, Flonase and sudafed, all without any significant improvement. Over the past 48 hours, she has developed some sinus pressure in her maxillary region and forehead with some associated headache. This feels similar to prior sinus infections she has had that respond well to z-pak. She denies any fever, chills, productive cough, hemoptysis or chest pain. She is unable to get in to see her PCP and we are going into the weekend so I offered to send a Rx for zpak. She was advised to seek immediate evaluation if her symptoms worsen despite treatment or she develops fever/chills/N/V or other concerning symptoms. She states her understanding and compliance. Rx sent to [facility].  Marguarite Arbour, MMS, PA-C [facility] Radiation Oncology Physician Assistant Direct Dial: [phone]  Fax: [phone]

## 2023-09-17 ENCOUNTER — Ambulatory Visit (INDEPENDENT_AMBULATORY_CARE_PROVIDER_SITE_OTHER): Payer: BC Managed Care – PPO | Admitting: Family Medicine

## 2023-09-17 ENCOUNTER — Encounter: Payer: Self-pay | Admitting: Family Medicine

## 2023-09-17 VITALS — BP 138/86 | HR 96 | Temp 97.8°F | Ht 63.0 in | Wt 192.6 lb

## 2023-09-17 DIAGNOSIS — F4323 Adjustment disorder with mixed anxiety and depressed mood: Secondary | ICD-10-CM

## 2023-09-17 DIAGNOSIS — F5101 Primary insomnia: Secondary | ICD-10-CM | POA: Diagnosis not present

## 2023-09-17 DIAGNOSIS — M47816 Spondylosis without myelopathy or radiculopathy, lumbar region: Secondary | ICD-10-CM | POA: Diagnosis not present

## 2023-09-17 DIAGNOSIS — N8502 Endometrial intraepithelial neoplasia [EIN]: Secondary | ICD-10-CM | POA: Diagnosis not present

## 2023-09-17 DIAGNOSIS — E88819 Insulin resistance, unspecified: Secondary | ICD-10-CM | POA: Diagnosis not present

## 2023-09-17 DIAGNOSIS — Z0001 Encounter for general adult medical examination with abnormal findings: Secondary | ICD-10-CM | POA: Diagnosis not present

## 2023-09-17 DIAGNOSIS — I10 Essential (primary) hypertension: Secondary | ICD-10-CM | POA: Diagnosis not present

## 2023-09-17 DIAGNOSIS — R000 Tachycardia, unspecified: Secondary | ICD-10-CM

## 2023-09-17 LAB — COMPREHENSIVE METABOLIC PANEL
ALT: 15 U/L (ref 0–35)
AST: 19 U/L (ref 0–37)
Albumin: 4.1 g/dL (ref 3.5–5.2)
Alkaline Phosphatase: 113 U/L (ref 39–117)
BUN: 14 mg/dL (ref 6–23)
CO2: 30 meq/L (ref 19–32)
Calcium: 9.3 mg/dL (ref 8.4–10.5)
Chloride: 105 meq/L (ref 96–112)
Creatinine, Ser: 0.89 mg/dL (ref 0.40–1.20)
GFR: 69.85 mL/min (ref >60.00)
Glucose, Bld: 106 mg/dL — ABNORMAL HIGH (ref 70–99)
Potassium: 4 meq/L (ref 3.5–5.1)
Sodium: 141 meq/L (ref 135–145)
Total Bilirubin: 0.5 mg/dL (ref 0.2–1.2)
Total Protein: 6.7 g/dL (ref 6.0–8.3)

## 2023-09-17 LAB — CBC WITH DIFFERENTIAL/PLATELET
Basophils Absolute: 0 10*3/uL (ref 0.0–0.1)
Basophils Relative: 0.6 % (ref 0.0–3.0)
Eosinophils Absolute: 0 10*3/uL (ref 0.0–0.7)
Eosinophils Relative: 0.5 % (ref 0.0–5.0)
HCT: 38.8 % (ref 36.0–46.0)
Hemoglobin: 12.7 g/dL (ref 12.0–15.0)
Lymphocytes Relative: 17.2 % (ref 12.0–46.0)
Lymphs Abs: 1.2 10*3/uL (ref 0.7–4.0)
MCHC: 32.6 g/dL (ref 30.0–36.0)
MCV: 95.4 fL (ref 78.0–100.0)
Monocytes Absolute: 0.5 10*3/uL (ref 0.1–1.0)
Monocytes Relative: 8 % (ref 3.0–12.0)
Neutro Abs: 5 10*3/uL (ref 1.4–7.7)
Neutrophils Relative %: 73.7 % (ref 43.0–77.0)
Platelets: 270 10*3/uL (ref 150.0–400.0)
RBC: 4.07 Mil/uL (ref 3.87–5.11)
RDW: 14.7 % (ref 11.5–15.5)
WBC: 6.8 10*3/uL (ref 4.0–10.5)

## 2023-09-17 LAB — LIPID PANEL
Cholesterol: 194 mg/dL (ref 0–200)
HDL: 57.1 mg/dL (ref >39.00)
LDL Cholesterol: 120 mg/dL — ABNORMAL HIGH (ref 0–99)
NonHDL: 136.47
Total CHOL/HDL Ratio: 3
Triglycerides: 81 mg/dL (ref 0.0–149.0)
VLDL: 16.2 mg/dL (ref 0.0–40.0)

## 2023-09-17 LAB — HEMOGLOBIN A1C: Hgb A1c MFr Bld: 6 % (ref 4.6–6.5)

## 2023-09-17 LAB — TSH: TSH: 0.96 u[IU]/mL (ref 0.35–5.50)

## 2023-09-17 MED ORDER — METOPROLOL SUCCINATE ER 100 MG PO TB24: 100.0000 mg | ORAL_TABLET | Freq: Every day | ORAL | 3 refills | Status: DC

## 2023-09-17 MED ORDER — TRAZODONE HCL 100 MG PO TABS: 100.0000 mg | ORAL_TABLET | Freq: Every day | ORAL | 3 refills | Status: DC

## 2023-09-17 NOTE — Progress Notes (Signed)
Subjective  Chief Complaint  Patient presents with   Annual Exam    Pt here for Annual Exam and is currently fasting    Hypertension    HPI: Jeanne Parks is a 62 y.o. female who presents to [facility] [place] today for a Female Wellness Visit. She also has the concerns and/or needs as listed above in the chief complaint. These will be addressed in addition to the Health Maintenance Visit.   Wellness Visit: annual visit with health maintenance review and exam  HM: screens are all current.  Mammogram is due in January.  She is continuing to cope with the loss of her husband.  Functioning well at work.  Declines flu shot today. Chronic disease f/u and/or acute problem visit: (deemed necessary to be done in addition to the wellness visit): Discussed the use of AI scribe software for clinical note transcription with the patient, who gave verbal consent to proceed.  History of Present Illness   The patient, with a history of hypertension and recent bereavement, presents with concerns about forgetfulness. She reports instances of discussing a topic and then forgetting the conversation shortly after. This forgetfulness is primarily occurring at home and has been noticed by her daughter. However, she reports no issues with memory or concentration at work. She is currently on Lexapro, which was initiated during a stressful period, but she is unsure if it is still beneficial. She reports functioning well overall, despite the ongoing grief process.  She denies anxiety symptoms, panic attacks, atypical low mood, decreased motivation.  She was struggling with some of these things.  Start Lexapro.  She has no adverse effects.  The patient also reports a persistent issue with her knee, which remains swollen and painful despite a previous arthroscopy. She has not sought further treatment for this issue.  Hypertension follow-up on metoprolol XL 50 daily.  Feels well.  No chest pain or  shortness of breath.  Does not check at home.  She has not noticed any side effects from the medication or any symptoms such as palpitations. She has not been regularly monitoring her blood pressure at home.  History of SVT without any palpitations.  Follow-up insomnia: Stable on trazodone.    Assessment  1. Encounter for well adult exam with abnormal findings   2. Essential hypertension   3. Sinus tachycardia   4. 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 Primary insomnia   7. Situational mixed anxiety and depressive disorder   8. Insulin resistance      Plan  Female Wellness Visit: Age appropriate Health Maintenance and Prevention measures were discussed with patient. Included topics are cancer screening recommendations, ways to keep healthy (see AVS) including dietary and exercise recommendations, regular eye and dental care, use of seat belts, and avoidance of moderate alcohol use and tobacco use.  Screens are current BMI: discussed patient's BMI and encouraged positive lifestyle modifications to help get to or maintain a target BMI. HM needs and immunizations were addressed and ordered. See below for orders. See HM and immunization section for updates. Routine labs and screening tests ordered including cmp, cbc and lipids where appropriate. Discussed recommendations regarding Vit D and calcium supplementation (see AVS)  Chronic disease management visit and/or acute problem visit: Assessment and Plan    Adjustment reaction/grief: Stable Experiencing grief and depressive symptoms following a significant loss. Reports forgetfulness, lack of joy, and emotional flatness. Currently  on Lexapro but feels it is not beneficial. Discussed potential benefits of continuing Lexapro to maintain emotional stability during the grieving process. Risks of discontinuing Lexapro include potential worsening of depressive symptoms  and emotional instability. Consideration of continuing Lexapro for another 3-6 months to assess improvement. - Continue Lexapro 10 mg daily - Monitor emotional status and reassess in 3-6 months -Counseling done to help with stress management and grief  Hypertension Blood pressure readings elevated (142/88 mmHg). Currently on metoprolol but did not take medication today. Discussed increasing metoprolol dose versus adding a diuretic. Decision made to increase metoprolol dose to better control blood pressure. Risks of increasing metoprolol include potential sluggishness. Benefits include better blood pressure control. Advised to monitor blood pressure and heart rate at home and keep a log. - Increase metoprolol XL to 100 mg daily and monitor for side effects - Monitor blood pressure and heart rate at home - Keep a log of blood pressure and heart rate readings - Follow up in 6 weeks to reassess blood pressure and medication tolerance  Primary insomnia -Well-controlled on trazodone 50 mg daily.  Refilled  Osteoarthritis in knee and back: Management strategies discussed.  Tylenol as needed.  General Health Maintenance Mammogram current and normal. Colonoscopy up to date. Immunizations current. - Continue routine health maintenance  Follow-up - Schedule follow-up appointment in 6 weeks - Check blood work today.     Orders Placed This Encounter  Procedures   CBC with Differential/Platelet   Comprehensive metabolic panel   Lipid panel   Hemoglobin A1c   TSH   Meds ordered this encounter  Medications   traZODone (DESYREL) 100 MG tablet    Sig: Take 1 tablet (100 mg total) by mouth at bedtime.    Dispense:  90 tablet    Refill:  3    Please hold for next due refill   metoprolol succinate (TOPROL-XL) 100 MG 24 hr tablet    Sig: Take 1 tablet (100 mg total) by mouth daily.    Dispense:  90 tablet    Refill:  3      Body mass index is 34.12 kg/m. Wt Readings from Last 3 Encounters:   09/17/23 192 lb 9.6 oz (87.4 kg)  04/25/23 188 lb 12.8 oz (85.6 kg)  04/02/23 190 lb (86.2 kg)     Patient Active Problem List   Diagnosis Date Noted Date Diagnosed   Essential hypertension 09/28/2019     Priority: High   Situational mixed anxiety and depressive disorder 02/16/2019     Priority: High   Obesity (BMI 30-39.9) 08/20/2018     Priority: High   Primary insomnia 08/20/2018     Priority: High    Trazodone 100mg  nightly    Endometrial intraepithelial neoplasia (EIN) 09/12/2022     Priority: Medium    DJD (degenerative joint disease), lumbar 06/06/2020     Priority: Medium     X-rays August 2021 Radicular sxs; treated with prn tramadol and flexeril, nightly gabapentin    Sinus tachycardia 07/24/2019     Priority: Medium    Insulin resistance 05/25/2019     Priority: Medium    OA (osteoarthritis) of right knee, s/p arthroscopy      Priority: Medium    Widowed 04/25/2023     Priority: Low    Husband passed of kidney failure complications April 11, 2023.    S/P laparoscopic hysterectomy 01/29/2022     Priority: Low   Seasonal allergies      Priority: Low  Health Maintenance  Topic Date Due   COVID-19 Vaccine (4 - 2023-24 season) 10/03/2023 (Originally 06/09/2023)   MAMMOGRAM  11/08/2023   DTaP/Tdap/Td (2 - Td or Tdap) 10/31/2026   Colonoscopy  05/02/2031   Zoster Vaccines- Shingrix  Completed   HPV VACCINES  Aged Out   INFLUENZA VACCINE  Discontinued   Immunization History  Administered Date(s) Administered   Influenza,inj,Quad PF,6+ Mos 08/20/2018, 08/01/2021   PFIZER(Purple Top)SARS-COV-2 Vaccination 01/23/2020, 02/15/2020, 10/26/2020   Pneumococcal Polysaccharide-23 05/25/2019   Tdap 10/31/2016   Zoster Recombinant(Shingrix) 08/01/2021, 03/14/2022   We updated and reviewed the patient's past history in detail and it is documented below. Allergies: Patient has No Known Allergies. Past Medical History Patient  has a past medical history of Allergy,  Anxiety, DJD (degenerative joint disease), lumbar (06/06/2020), History of chicken pox, HLD (hyperlipidemia), HSV-1 infection, Hypertension, OA (osteoarthritis) of right knee, s/p arthroscopy, PMB (postmenopausal bleeding), Seasonal allergies, Skin cancer, Tachycardia (12/21/2021), Tooth size and form abnormality (12/21/2021), and Wears glasses for reading. Past Surgical History Patient  has a past surgical history that includes Cesarean section (08/27/1986); Dilation and curettage of uterus (1997); Knee arthroscopy (Right, 02/2015); Tonsillectomy (1967); Breast cyst excision (Left, 10+ yrs ago); colonscopy (2021); Dilatation & curettage/hysteroscopy with myosure (N/A, 12/26/2021); Total laparoscopic hysterectomy with bilateral salpingo oophorectomy (Bilateral, 01/29/2022); Cystoscopy (N/A, 01/29/2022); and Abdominal hysterectomy (01/2022). Family History: Patient family history includes Breast cancer (age of onset: 42) in her mother; Cancer in her paternal uncle; Colon polyps in her father; Diabetes type I (age of onset: 67) in her sister; Heart disease in her father; High Cholesterol in her sister; Hypertension in her mother; Lung cancer in her maternal aunt. Social History:  Patient  reports that she has never smoked. She has been exposed to tobacco smoke. She has never used smokeless tobacco. She reports current alcohol use of about 2.0 - 3.0 standard drinks of alcohol per week. She reports that she does not use drugs.  Review of Systems: Constitutional: negative for fever or malaise Ophthalmic: negative for photophobia, double vision or loss of vision Cardiovascular: negative for chest pain, dyspnea on exertion, or new LE swelling Respiratory: negative for SOB or persistent cough Gastrointestinal: negative for abdominal pain, change in bowel habits or melena Genitourinary: negative for dysuria or gross hematuria, no abnormal uterine bleeding or disharge Musculoskeletal: negative for new gait  disturbance or muscular weakness Integumentary: negative for new or persistent rashes, no breast lumps Neurological: negative for TIA or stroke symptoms Psychiatric: negative for SI or delusions Allergic/Immunologic: negative for hives  Patient Care Team    Relationship Specialty Notifications Start End  Willow Ora, MD PCP - General Family Medicine  07/24/19   Romualdo Bolk, MD (Inactive) Consulting Physician Obstetrics and Gynecology  07/25/18   Benson Setting, PT Physical Therapist Physical Therapy  06/20/20     Objective  Vitals: BP 138/86   Pulse 96   Temp 97.8 F (36.6 C)   Ht 5\' 3"  (1.6 m)   Wt 192 lb 9.6 oz (87.4 kg)   LMP 01/06/2014 (Approximate)   SpO2 96%   BMI 34.12 kg/m  General:  Well developed, well nourished, no acute distress  Psych:  Alert and orientedx3,normal mood and affect HEENT:  Normocephalic, atraumatic, non-icteric sclera,  supple neck without adenopathy, mass or thyromegaly Cardiovascular:  Normal S1, S2, RRR without gallop, rub or murmur Respiratory:  Good breath sounds bilaterally, CTAB with normal respiratory effort Gastrointestinal: normal bowel sounds, soft, non-tender, no noted masses. No  HSM MSK: extremities without edema, joints without erythema or swelling Neurologic:    Mental status is normal.  Gross motor and sensory exams are normal.  No tremor  Commons side effects, risks, benefits, and alternatives for medications and treatment plan prescribed today were discussed, and the patient expressed understanding of the given instructions. Patient is instructed to call or message via MyChart if he/she has any questions or concerns regarding our treatment plan. No barriers to understanding were identified. We discussed Red Flag symptoms and signs in detail. Patient expressed understanding regarding what to do in case of urgent or emergency type symptoms.  Medication list was reconciled, printed and provided to the patient in AVS. Patient  instructions and summary information was reviewed with the patient as documented in the AVS. This note was prepared with assistance of Dragon voice recognition software. Occasional wrong-word or sound-a-like substitutions may have occurred due to the inherent limitations of voice recognition software

## 2023-09-17 NOTE — Patient Instructions (Signed)
Please return in 6 weeks to recheck blood pressure.   I will release your lab results to you on your MyChart account with further instructions. You may see the results before I do, but when I review them I will send you a message with my report or have my assistant call you if things need to be discussed. Please reply to my message with any questions. Thank you!   If you have any questions or concerns, please don't hesitate to send me a message via MyChart or call the office at [phone]. Thank you for visiting with [place] today! It's our pleasure caring for you.   VISIT SUMMARY:  During today's visit, we discussed your concerns about forgetfulness, ongoing knee pain, and elevated blood pressure. We also reviewed your current medications and overall health maintenance.  YOUR PLAN:  -GRIEF AND DEPRESSION: You are experiencing grief and some depressive symptoms following a significant loss. We discussed the potential benefits of continuing Lexapro to help maintain emotional stability during this difficult time. We recommend continuing Lexapro and monitoring your emotional status, with a reassessment in 3-6 months.  -HYPERTENSION: Your blood pressure readings have been higher than desired. We decided to increase your Metoprolol dose to 100 mg daily to better control your blood pressure. Please monitor your blood pressure and heart rate at home, keep a log of the readings, and follow up in 6 weeks to reassess. I WENT AHEAD AND ORDERED THE LARGER PILL FOR YOU SINCE IT LOOKS LIKE YOU ARE DUE FOR A REFILL.   -GENERAL HEALTH MAINTENANCE: Your routine health maintenance is up to date, including a current and normal mammogram and colonoscopy, as well as current immunizations. Continue with your routine health maintenance.  INSTRUCTIONS:  Please schedule a follow-up appointment in 6 weeks. Additionally, make sure to check your blood work today.

## 2023-09-19 NOTE — Progress Notes (Signed)
Labs reviewed.  The 10-year ASCVD risk score (Arnett DK, et al., 2019) is: 5.4%   Values used to calculate the score:     Age: 62 years     Sex: Female     Is Non-Hispanic African American: No     Diabetic: No     Tobacco smoker: No     Systolic Blood Pressure: 138 mmHg     Is BP treated: Yes     HDL Cholesterol: 57.1 mg/dL     Total Cholesterol: 194 mg/dL

## 2023-10-29 ENCOUNTER — Other Ambulatory Visit: Payer: Self-pay | Admitting: Family Medicine

## 2023-10-29 ENCOUNTER — Ambulatory Visit: Payer: BC Managed Care – PPO | Admitting: Family Medicine

## 2023-10-30 ENCOUNTER — Ambulatory Visit: Payer: BC Managed Care – PPO | Admitting: Family Medicine

## 2023-10-30 ENCOUNTER — Encounter: Payer: Self-pay | Admitting: Family Medicine

## 2023-10-30 VITALS — BP 126/78 | HR 87 | Temp 97.7°F | Ht 63.0 in | Wt 192.6 lb

## 2023-10-30 DIAGNOSIS — I10 Essential (primary) hypertension: Secondary | ICD-10-CM | POA: Diagnosis not present

## 2023-10-30 DIAGNOSIS — F4323 Adjustment disorder with mixed anxiety and depressed mood: Secondary | ICD-10-CM | POA: Diagnosis not present

## 2023-10-30 MED ORDER — ESCITALOPRAM OXALATE 10 MG PO TABS: 5.0000 mg | ORAL_TABLET | Freq: Every day | ORAL | Status: DC

## 2023-10-30 NOTE — Progress Notes (Signed)
Subjective  CC:  Chief Complaint  Patient presents with   Hypertension    HPI: Jeanne Parks is a 63 y.o. female who presents to the office today to address the problems listed above in the chief complaint. Hypertension f/u: now controlled on metoprolol 100 daily xl. Feels well. Home bp x 2 normal. No AE Mood: lexapro 10 ... Feels a little cloudy. Has helped manage adjustment reaction. Uses xanax and likes that better; does not use daily.   Assessment  1. Essential hypertension   2. Situational mixed anxiety and depressive disorder      Plan   Hypertension f/u: BP control is well controlled. Metoprolol xl 100 every day Mood medication: rec decreasing lexapro to 5 mg daily and monitor x 6-12 weeks. Can then consider weaning to off if stable. Rec rare xanax use. Education given.   Education regarding management of these chronic disease states was given. Management strategies discussed on successive visits include dietary and exercise recommendations, goals of achieving and maintaining IBW, and lifestyle modifications aiming for adequate sleep and minimizing stressors.   Follow up: 6 mo for htn and mood f/u  No orders of the defined types were placed in this encounter.  Meds ordered this encounter  Medications   escitalopram (LEXAPRO) 10 MG tablet    Sig: Take 0.5 tablets (5 mg total) by mouth daily.      BP Readings from Last 3 Encounters:  10/30/23 126/78  09/17/23 138/86  04/25/23 130/80   Wt Readings from Last 3 Encounters:  10/30/23 192 lb 9.6 oz (87.4 kg)  09/17/23 192 lb 9.6 oz (87.4 kg)  04/25/23 188 lb 12.8 oz (85.6 kg)    Lab Results  Component Value Date   CHOL 194 09/17/2023   CHOL 205 (H) 09/12/2022   CHOL 212 (H) 08/01/2021   Lab Results  Component Value Date   HDL 57.10 09/17/2023   HDL 61.00 09/12/2022   HDL 58.90 08/01/2021   Lab Results  Component Value Date   LDLCALC 120 (H) 09/17/2023   LDLCALC 126 (H) 09/12/2022   LDLCALC 130 (H)  08/01/2021   Lab Results  Component Value Date   TRIG 81.0 09/17/2023   TRIG 93.0 09/12/2022   TRIG 114.0 08/01/2021   Lab Results  Component Value Date   CHOLHDL 3 09/17/2023   CHOLHDL 3 09/12/2022   CHOLHDL 4 08/01/2021   No results found for: "LDLDIRECT" Lab Results  Component Value Date   CREATININE 0.89 09/17/2023   BUN 14 09/17/2023   NA 141 09/17/2023   K 4.0 09/17/2023   CL 105 09/17/2023   CO2 30 09/17/2023    The 10-year ASCVD risk score (Arnett DK, et al., 2019) is: 4.6%   Values used to calculate the score:     Age: 17 years     Sex: Female     Is Non-Hispanic African American: No     Diabetic: No     Tobacco smoker: No     Systolic Blood Pressure: 126 mmHg     Is BP treated: Yes     HDL Cholesterol: 57.1 mg/dL     Total Cholesterol: 194 mg/dL  I reviewed the patients updated PMH, FH, and SocHx.    Patient Active Problem List   Diagnosis Date Noted   Essential hypertension 09/28/2019    Priority: High   Situational mixed anxiety and depressive disorder 02/16/2019    Priority: High   Obesity (BMI 30-39.9) 08/20/2018    Priority:  High   Primary insomnia 08/20/2018    Priority: High   Endometrial intraepithelial neoplasia (EIN) 09/12/2022    Priority: Medium    DJD (degenerative joint disease), lumbar 06/06/2020    Priority: Medium    Sinus tachycardia 07/24/2019    Priority: Medium    Insulin resistance 05/25/2019    Priority: Medium    OA (osteoarthritis) of right knee, s/p arthroscopy     Priority: Medium    Widowed 04/25/2023    Priority: Low   S/P laparoscopic hysterectomy 01/29/2022    Priority: Low   Seasonal allergies     Priority: Low    Allergies: Patient has no known allergies.  Social History: Patient  reports that she has never smoked. She has been exposed to tobacco smoke. She has never used smokeless tobacco. She reports current alcohol use of about 2.0 - 3.0 standard drinks of alcohol per week. She reports that she does  not use drugs.  Current Meds  Medication Sig   ALPRAZolam (XANAX) 0.5 MG tablet Take 1 tablet (0.5 mg total) by mouth 2 (two) times daily as needed for anxiety.   Biotin 30865 MCG TABS Take by mouth.   cetirizine (ZYRTEC) 10 MG tablet Take 10 mg by mouth daily.   cholecalciferol (VITAMIN D) 1000 units tablet Take 1,000 Units by mouth daily.   Cyanocobalamin (VITAMIN B 12 PO) Take by mouth.   cyclobenzaprine (FLEXERIL) 10 MG tablet Take 1 tablet (10 mg total) by mouth at bedtime as needed for muscle spasms.   gabapentin (NEURONTIN) 300 MG capsule TAKE ONE CAPSULE BY MOUTH AT BEDTIME   ibuprofen (ADVIL) 800 MG tablet Take 1 tablet (800 mg total) by mouth every 8 (eight) hours as needed.   metoprolol succinate (TOPROL-XL) 100 MG 24 hr tablet Take 1 tablet (100 mg total) by mouth daily.   traMADol (ULTRAM) 50 MG tablet Take 1 tablet (50 mg total) by mouth every 6 (six) hours as needed.   traZODone (DESYREL) 100 MG tablet Take 1 tablet (100 mg total) by mouth at bedtime.    Review of Systems: Cardiovascular: negative for chest pain, palpitations, leg swelling, orthopnea Respiratory: negative for SOB, wheezing or persistent cough Gastrointestinal: negative for abdominal pain Genitourinary: negative for dysuria or gross hematuria  Objective  Vitals: BP 126/78   Pulse 87   Temp 97.7 F (36.5 C)   Ht 5\' 3"  (1.6 m)   Wt 192 lb 9.6 oz (87.4 kg)   LMP 01/06/2014 (Approximate)   SpO2 96%   BMI 34.12 kg/m  General: no acute distress  Psych:  Alert and oriented, normal mood and affect HEENT:  Normocephalic, atraumatic, supple neck  Cardiovascular:  RRR without murmur. no edema Respiratory:  Good breath sounds bilaterally, CTAB with normal respiratory effort  Commons side effects, risks, benefits, and alternatives for medications and treatment plan prescribed today were discussed, and the patient expressed understanding of the given instructions. Patient is instructed to call or message via  MyChart if he/she has any questions or concerns regarding our treatment plan. No barriers to understanding were identified. We discussed Red Flag symptoms and signs in detail. Patient expressed understanding regarding what to do in case of urgent or emergency type symptoms.  Medication list was reconciled, printed and provided to the patient in AVS. Patient instructions and summary information was reviewed with the patient as documented in the AVS. This note was prepared with assistance of Dragon voice recognition software. Occasional wrong-word or sound-a-like substitutions may have occurred due  to the inherent limitation

## 2023-10-30 NOTE — Patient Instructions (Signed)
Please return in 6 months for hypertension follow up.   If you have any questions or concerns, please don't hesitate to send me a message via MyChart or call the office at [phone]. Thank you for visiting with Jeanne Parks today! It's our pleasure caring for you.   VISIT SUMMARY:  During today's visit, we discussed your current health status and reviewed your medications. You reported feeling fine with your current blood pressure medication but experienced some side effects with your anxiety medication. We have made some adjustments to your treatment plan to help manage these issues more effectively.  YOUR PLAN:  -GENERALIZED ANXIETY DISORDER: Generalized Anxiety Disorder is a condition characterized by persistent and excessive worry about various aspects of life. You reported feeling cloudy on Lexapro, so we have reduced the dose to 5 mg daily to help mitigate these side effects while maintaining its benefits. We also discussed the risks of Xanax dependency and advised you to use it only for acute stress or panic. Our goal is to eventually wean you off Xanax and possibly Lexapro if your condition remains stable.  -HYPERTENSION: Hypertension, or high blood pressure, is a condition where the force of the blood against your artery walls is too high. Your blood pressure has improved with the increased dose of metoprolol to 100 mg daily, and you have not reported any adverse effects. Please continue taking metoprolol as prescribed, and we will reassess your blood pressure in six months.  -GENERAL HEALTH MAINTENANCE: We need to confirm your pneumonia vaccine status to ensure you are up-to-date with your vaccinations. This is important for your overall health and to prevent potential infections.  INSTRUCTIONS:  Please follow up in six months to reassess your blood pressure and anxiety management. Additionally, check your pneumonia vaccine status and update Jeanne Parks accordingly.

## 2024-02-13 ENCOUNTER — Ambulatory Visit: Payer: Self-pay

## 2024-02-13 NOTE — Telephone Encounter (Signed)
 Copied from CRM [id number]. Topic: Clinical - Red Word Triage >> Feb 13, 2024  8:49 AM [place] E wrote: [facility] that prompted transfer to Nurse Triage: Patient states she feels "off" and does not feel normal. She is having concerns about her heart rate and blood pressure. Resting heart rate was 82 and her blood pressure was 122/73 from this morning.   Chief Complaint: general discomfort Symptoms: General discomfort Frequency: constant Pertinent Negatives: Patient denies chest pain Disposition: [] ED /[] Urgent Care (no appt availability in office) / [x] Appointment(In office/virtual)/ []  [facility]/ [] Home Care/ [] Refused Recommended Disposition /[] [facility]/ []  Follow-up with PCP Additional Notes: Patient reports general feeling of discomfort. Says she can "feel" her heart beating, but denies palpitations or "hard" beats. Her resting HR is 87-90 bmp, per patient. Patient also reports her head feels "funny" denies headache or brain fog as well. Pt states that overall she just feels "funny" and would like to get evaluated. PCP appt for 5/12   Reason for Disposition  Problems with anxiety or stress  Answer Assessment - Initial Assessment Questions 1. DESCRIPTION: "Please describe your heart rate or heartbeat that you are having" (e.g., fast/slow, regular/irregular, skipped or extra beats, "palpitations")     Can "feel" heart beating  2. ONSET: "When did it start?" (Minutes, hours or days)      2-3   3. DURATION: "How long does it last" (e.g., seconds, minutes, hours)     Never goes away  4. PATTERN "Does it come and go, or has it been constant since it started?"  "Does it get worse with exertion?"   "Are you feeling it now?"     Constant  5. TAP: "Using your hand, can you tap out what you are feeling on a chair or table in front of you, so that I can hear?" (Note: not all patients can do this)       No  6. HEART RATE: "Can you tell me your heart rate?" "How many  beats in 15 seconds?"  (Note: not all patients can do this)       87  7. RECURRENT SYMPTOM: "Have you ever had this before?" If Yes, ask: "When was the last time?" and "What happened that time?"      No  8. CAUSE: "What do you think is causing the palpitations?"     Unsure  9. CARDIAC HISTORY: "Do you have any history of heart disease?" (e.g., heart attack, angina, bypass surgery, angioplasty, arrhythmia)      No  10. OTHER SYMPTOMS: "Do you have any other symptoms?" (e.g., dizziness, chest pain, sweating, difficulty breathing)       General disomfort  11. PREGNANCY: "Is there any chance you are pregnant?" "When was your last menstrual period?"       No  Protocols used: Heart Rate and Heartbeat Questions-A-AH

## 2024-02-14 NOTE — Telephone Encounter (Signed)
 Noted.

## 2024-02-17 ENCOUNTER — Encounter: Payer: Self-pay | Admitting: Family Medicine

## 2024-02-17 ENCOUNTER — Ambulatory Visit (INDEPENDENT_AMBULATORY_CARE_PROVIDER_SITE_OTHER): Admitting: Family Medicine

## 2024-02-17 VITALS — BP 136/80 | HR 73 | Temp 97.7°F | Ht 63.0 in | Wt 190.4 lb

## 2024-02-17 DIAGNOSIS — I10 Essential (primary) hypertension: Secondary | ICD-10-CM | POA: Diagnosis not present

## 2024-02-17 DIAGNOSIS — R000 Tachycardia, unspecified: Secondary | ICD-10-CM

## 2024-02-17 DIAGNOSIS — R002 Palpitations: Secondary | ICD-10-CM | POA: Diagnosis not present

## 2024-02-17 DIAGNOSIS — F4323 Adjustment disorder with mixed anxiety and depressed mood: Secondary | ICD-10-CM | POA: Diagnosis not present

## 2024-02-17 LAB — BASIC METABOLIC PANEL WITH GFR
BUN: 17 mg/dL (ref 6–23)
CO2: 29 meq/L (ref 19–32)
Calcium: 9.7 mg/dL (ref 8.4–10.5)
Chloride: 104 meq/L (ref 96–112)
Creatinine, Ser: 0.89 mg/dL (ref 0.40–1.20)
GFR: 69.64 mL/min (ref >60.00)
Glucose, Bld: 103 mg/dL — ABNORMAL HIGH (ref 70–99)
Potassium: 4.1 meq/L (ref 3.5–5.1)
Sodium: 141 meq/L (ref 135–145)

## 2024-02-17 LAB — CBC WITH DIFFERENTIAL/PLATELET
Basophils Absolute: 0.1 10*3/uL (ref 0.0–0.1)
Basophils Relative: 0.5 % (ref 0.0–3.0)
Eosinophils Absolute: 0 10*3/uL (ref 0.0–0.7)
Eosinophils Relative: 0.3 % (ref 0.0–5.0)
HCT: 39.1 % (ref 36.0–46.0)
Hemoglobin: 12.8 g/dL (ref 12.0–15.0)
Lymphocytes Relative: 12.6 % (ref 12.0–46.0)
Lymphs Abs: 1.3 10*3/uL (ref 0.7–4.0)
MCHC: 32.6 g/dL (ref 30.0–36.0)
MCV: 96.9 fl (ref 78.0–100.0)
Monocytes Absolute: 0.9 10*3/uL (ref 0.1–1.0)
Monocytes Relative: 8.8 % (ref 3.0–12.0)
Neutro Abs: 8.3 10*3/uL — ABNORMAL HIGH (ref 1.4–7.7)
Neutrophils Relative %: 77.8 % — ABNORMAL HIGH (ref 43.0–77.0)
Platelets: 308 10*3/uL (ref 150.0–400.0)
RBC: 4.04 Mil/uL (ref 3.87–5.11)
RDW: 14.2 % (ref 11.5–15.5)
WBC: 10.6 10*3/uL — ABNORMAL HIGH (ref 4.0–10.5)

## 2024-02-17 LAB — TSH: TSH: 0.71 u[IU]/mL (ref 0.35–5.50)

## 2024-02-17 LAB — HEMOGLOBIN A1C: Hgb A1c MFr Bld: 6 % (ref 4.6–6.5)

## 2024-02-17 MED ORDER — ALPRAZOLAM 0.5 MG PO TABS: 0.5000 mg | ORAL_TABLET | Freq: Every day | ORAL | 0 refills | Status: AC | PRN

## 2024-02-17 MED ORDER — ESCITALOPRAM OXALATE 10 MG PO TABS: 10.0000 mg | ORAL_TABLET | Freq: Every day | ORAL | Status: AC

## 2024-02-17 NOTE — Patient Instructions (Addendum)
 Please follow up in October for cpe. Sooner if worsening symptoms.   I will release your lab results to you on your MyChart account with further instructions. You may see the results before I do, but when I review them I will send you a message with my report or have my assistant call you if things need to be discussed. Please reply to my message with any questions. Thank you!   I will release your lab results to you on your MyChart account with further instructions. You may see the results before I do, but when I review them I will send you a message with my report or have my assistant call you if things need to be discussed. Please reply to my message with any questions. Thank you!    VISIT SUMMARY: Today, we discussed your recent experience with palpitations and a fast heart rate. You mentioned that your heart rate was consistently above 100 beats per minute last week, which made you feel nervous. We reviewed your history of sinus tachycardia and your current medication. Your heart rate has since normalized, but we will take some steps to ensure there are no underlying issues.  YOUR PLAN: -SINUS TACHYCARDIA: Sinus tachycardia is a condition where your heart beats faster than normal. We will order blood tests to rule out anemia and hyperthyroidism as possible causes. Additionally, we will check your A1c and renal panel. It is advised to reduce your caffeine intake. Please follow up if your symptoms persist.  INSTRUCTIONS: We will conduct blood tests to rule out anemia and hyperthyroidism, and to check your A1c and renal panel. Please reduce your caffeine intake and follow up if your symptoms persist.                      Contains text generated by Abridge.                                 Contains text generated by Abridge.

## 2024-02-17 NOTE — Progress Notes (Signed)
 Subjective  CC:  Chief Complaint  Patient presents with   General concerns    Pt stated that last week, she/body started to feel a little off. Can not really describe the feeling and felt like her heart was racing. But since then, it has gotten better.     HPI: Jeanne Parks is a 63 y.o. female who presents to the office today to address the problems listed above in the chief complaint. Discussed the use of AI scribe software for clinical note transcription with the patient, who gave verbal consent to proceed.  History of Present Illness Jeanne Parks is a 63 year old female with sinus tachycardia who presents with palpitations.  She began experiencing palpitations last week, around Tuesday or Wednesday, characterized by a sensation of her heart racing. Her heart rate was consistently above 100 but less than 120 beats per minute, which she monitored using a blood pressure device at work. This was notably faster than her usual heart rate, causing her to feel nervous.  She denies any irregular heartbeats, describing the sensation as 'just fast'. During these episodes, she did not experience chest pain, leg swelling, or significant shortness of breath, but did feel her head was 'weird' and not clear. She associates her stress levels as constant and mentions regular consumption of caffeinated tea and occasional mini Cokes, but no coffee.  Her symptoms persisted for a couple of days before she sought medical advice. Since then, her heart rate has normalized to between 80 and 90 beats per minute. She has a history of sinus tachycardia dating back to 2020 and is currently on a beta blocker. She has a h/o sinus tach: first evaluated 2019. Reviewed ekgs. On bb.   Mood: we lowered celexa dose to 5 back in January due to sxs of "feeling foggy". However, since, she may be having more anxiety related sxs. Still with grief. Stressful tax season. Out of xanax . See last note.   Assessment  1.  Palpitations   2. Sinus tachycardia   3. Essential hypertension   4. Situational mixed anxiety and depressive disorder      Plan  Assessment and Plan Assessment & Plan Sinus tachycardia Sinus tachycardia with recent episode of elevated heart rate. Possible caffeine contribution. Differential includes anemia and hyperthyroidism. - Order labs to rule out anemia and hyperthyroidism. - Order A1c and renal panel. - Advise reducing caffeine intake. - Follow up if symptoms persist.  Adjustment anxiety: active.  Increase celexa back up to 10 daily.  Add prn xanax .  Monitor mood and side effects   Follow up: oct for cpe Orders Placed This Encounter  Procedures   CBC with Differential/Platelet   Hemoglobin A1c   TSH   Basic metabolic panel with GFR   Meds ordered this encounter  Medications   escitalopram  (LEXAPRO ) 10 MG tablet    Sig: Take 1 tablet (10 mg total) by mouth daily.   ALPRAZolam  (XANAX ) 0.5 MG tablet    Sig: Take 1 tablet (0.5 mg total) by mouth daily as needed for anxiety.    Dispense:  20 tablet    Refill:  0     I reviewed the patients updated PMH, FH, and SocHx.  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   cetirizine (ZYRTEC) 10 MG tablet Take 10 mg by mouth daily.   cholecalciferol (VITAMIN D ) 1000 units tablet Take 1,000 Units by mouth daily.   Cyanocobalamin (VITAMIN B 12 PO) Take by mouth.   cyclobenzaprine  (FLEXERIL ) 10 MG tablet Take 1 tablet (10 mg total) by mouth at bedtime as needed for muscle spasms.   gabapentin  (NEURONTIN ) 300 MG capsule TAKE ONE CAPSULE BY MOUTH AT BEDTIME   ibuprofen  (ADVIL ) 800 MG tablet Take 1 tablet (800 mg total) by mouth every 8 (eight) hours as needed.   metoprolol  succinate (TOPROL -XL) 100 MG 24 hr tablet Take 1 tablet (100 mg total) by mouth daily.   traMADol  (ULTRAM ) 50 MG tablet Take 1 tablet (50 mg total) by mouth every 6 (six) hours as needed.   traZODone  (DESYREL ) 100 MG tablet Take 1 tablet (100 mg total) by mouth at bedtime.   [DISCONTINUED] ALPRAZolam  (XANAX ) 0.5 MG tablet Take 1 tablet (0.5 mg total) by mouth 2 (two) times daily as needed for anxiety.   [DISCONTINUED] escitalopram  (LEXAPRO ) 10 MG tablet Take 0.5 tablets (5 mg total) by mouth daily.   Allergies: Patient has no known allergies. Family History: Patient family history includes Breast cancer (age of onset: 97) in her mother; Cancer in her paternal uncle; Colon polyps in her father; Diabetes type I (age of onset: 27) in her sister; Heart disease in her father; High Cholesterol in her sister; Hypertension in her mother; Lung cancer in her maternal aunt. Social History:  Patient  reports that she has never smoked. She has been exposed to tobacco smoke. She has never used smokeless tobacco. She reports current alcohol use of about 2.0 - 3.0 standard drinks of alcohol per week. She reports that she does not use drugs.  Review of Systems: Constitutional: Negative for fever malaise or anorexia Cardiovascular: negative for chest pain Respiratory: negative for SOB or persistent cough Gastrointestinal: negative for abdominal pain  Objective  Vitals: BP  136/80   Pulse 73   Temp 97.7 F (36.5 C)   Ht 5\' 3"  (1.6 m)   Wt 190 lb 6.4 oz (86.4 kg)   LMP 01/06/2014 (Approximate)   SpO2 97%   BMI 33.73 kg/m  General: no acute distress , A&Ox3, well appearing Psych; sad affect HEENT: PEERL, conjunctiva normal, neck is supple Cardiovascular:  RRR without murmur or gallop.  Respiratory:  Good breath sounds bilaterally, CTAB with normal respiratory effort Skin:  Warm, no rashes Commons side effects, risks, benefits, and alternatives for medications and treatment plan prescribed today were discussed, and the patient expressed understanding of the given instructions. Patient is instructed to call or message via MyChart if he/she has any questions or concerns regarding our treatment plan. No barriers to understanding were identified. We discussed Red Flag symptoms and signs  in detail. Patient expressed understanding regarding what to do in case of urgent or emergency type symptoms.  Medication list was reconciled, printed and provided to the patient in AVS. Patient instructions and summary information was reviewed with the patient as documented in the AVS. This note was prepared with assistance of Dragon voice recognition software. Occasional wrong-word or sound-a-like substitutions may have occurred due to the inherent limitations of voice recognition software

## 2024-02-18 ENCOUNTER — Ambulatory Visit: Payer: Self-pay | Admitting: Family Medicine

## 2024-02-18 NOTE — Progress Notes (Signed)
 See mychart note Dear Ms. Raquet, Your lab results look good overall and are reassuring. I do not see a cause for your symptoms here. Monitor as we discussed and let me know if things persist or worsen. Sincerely, Dr. Jonelle Neri

## 2024-04-28 ENCOUNTER — Ambulatory Visit: Payer: BC Managed Care – PPO | Admitting: Family Medicine

## 2024-04-29 ENCOUNTER — Other Ambulatory Visit: Payer: Self-pay | Admitting: Family Medicine

## 2024-05-05 ENCOUNTER — Encounter: Payer: Self-pay | Admitting: Family Medicine

## 2024-05-05 ENCOUNTER — Ambulatory Visit (INDEPENDENT_AMBULATORY_CARE_PROVIDER_SITE_OTHER): Admitting: Family Medicine

## 2024-05-05 VITALS — BP 124/78 | HR 84 | Temp 97.7°F | Ht 63.0 in | Wt 188.2 lb

## 2024-05-05 DIAGNOSIS — F4323 Adjustment disorder with mixed anxiety and depressed mood: Secondary | ICD-10-CM | POA: Diagnosis not present

## 2024-05-05 DIAGNOSIS — R000 Tachycardia, unspecified: Secondary | ICD-10-CM

## 2024-05-05 DIAGNOSIS — I10 Essential (primary) hypertension: Secondary | ICD-10-CM | POA: Diagnosis not present

## 2024-05-05 DIAGNOSIS — F5101 Primary insomnia: Secondary | ICD-10-CM | POA: Diagnosis not present

## 2024-05-05 NOTE — Progress Notes (Signed)
 Subjective  CC:  Chief Complaint  Patient presents with   Hypertension    HPI: Jeanne Parks is a 63 y.o. female who presents to the office today to address the problems listed above in the chief complaint. Hypertension f/u:  Discussed the use of AI scribe software for clinical note transcription with the patient, who gave verbal consent to proceed.  History of Present Illness Jeanne Parks is a 63 year old female with hypertension and insulin resistance who presents for a follow-up visit.  Mood has stabilized after increasing escitalopram  dosage to 10 mg. They are currently taking escitalopram  and Xanax . Sleep is reported as 'pretty good' with the use of trazodone .  They have experienced palpitations in the past, but these have settled. Blood pressure readings have been around 120/80 mmHg. They have not checked their blood pressure recently but recall it being in the 70s the last time they did. They are currently on a beta blocker, Toprol .  They experience swelling in their ankles but not in their fingers. They have not been on a diuretic before.  They have difficulty losing weight, noting a preference for 'meat and potatoes' and a lack of interest in salads. They acknowledge the challenge of caloric intake and insulin resistance. They are open to trying to incorporate more vegetables and grains into their diet.  They engage in regular physical activity, walking their dog Brutus on their [place]acre property for about 20 minutes each session, multiple times a day. They have not engaged in strength training before.  They are seven months overdue for a mammogram.   Assessment  1. Essential hypertension   2. Situational mixed anxiety and depressive disorder   3. Primary insomnia   4. Sinus tachycardia      Plan  Assessment and Plan Assessment & Plan Hypertension with lower extremity edema and palpitations Blood pressure near target; diastolic at times in the eighties. Beta  blocker maintains heart rate; adjustments may be needed. Edema may benefit from diuretic. Palpitations resolved. - Monitor blood pressure at home for several weeks. - Consider increasing Toprol  if heart rate remains high. - Consider adding diuretic if blood pressure remains elevated or edema persists.  Obesity and insulin resistance Weight loss hindered by insulin resistance and dietary habits. Escitalopram  may contribute to difficulty. Diet high in meat and potatoes, low in vegetables. Regular walking, lacks strength training. - Encourage more vegetables, fibers, and grains in diet. - Recommend exploring whole foods, plant-based recipes. - Advise on benefits of strength training and resistance exercises. - Encourage continuation of regular walking and consider varying exercise routine.  Major depressive disorder well controlled on lexapro  10 Sleep adequate with trazodone  100 qhs  General Health Maintenance Seven months overdue for mammogram. - Encourage scheduling a mammogram as soon as possible.   Education regarding management of these chronic disease states was given. Management strategies discussed on successive visits include dietary and exercise recommendations, goals of achieving and maintaining IBW, and lifestyle modifications aiming for adequate sleep and minimizing stressors.  Follow up: dec for cpe as scheduled.  No orders of the defined types were placed in this encounter.  No orders of the defined types were placed in this encounter.     BP Readings from Last 3 Encounters:  05/05/24 124/78  02/17/24 136/80  10/30/23 126/78   Wt Readings from Last 3 Encounters:  05/05/24 188 lb 3.2 oz (85.4 kg)  02/17/24 190 lb 6.4 oz (86.4 kg)  10/30/23 192 lb 9.6 oz (87.4  kg)    Lab Results  Component Value Date   CHOL 194 09/17/2023   CHOL 205 (H) 09/12/2022   CHOL 212 (H) 08/01/2021   Lab Results  Component Value Date   HDL 57.10 09/17/2023   HDL 61.00 09/12/2022    HDL 58.90 08/01/2021   Lab Results  Component Value Date   LDLCALC 120 (H) 09/17/2023   LDLCALC 126 (H) 09/12/2022   LDLCALC 130 (H) 08/01/2021   Lab Results  Component Value Date   TRIG 81.0 09/17/2023   TRIG 93.0 09/12/2022   TRIG 114.0 08/01/2021   Lab Results  Component Value Date   CHOLHDL 3 09/17/2023   CHOLHDL 3 09/12/2022   CHOLHDL 4 08/01/2021   No results found for: LDLDIRECT Lab Results  Component Value Date   CREATININE 0.89 02/17/2024   BUN 17 02/17/2024   NA 141 02/17/2024   K 4.1 02/17/2024   CL 104 02/17/2024   CO2 29 02/17/2024    The 10-year ASCVD risk score (Arnett DK, et al., 2019) is: 4.9%   Values used to calculate the score:     Age: 49 years     Clincally relevant sex: Female     Is Non-Hispanic African American: No     Diabetic: No     Tobacco smoker: No     Systolic Blood Pressure: 124 mmHg     Is BP treated: Yes     HDL Cholesterol: 57.1 mg/dL     Total Cholesterol: 194 mg/dL  I reviewed the patients updated PMH, FH, and SocHx.    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  Social History: Patient  reports that she has never smoked. She has been exposed to tobacco  smoke. She has never used smokeless tobacco. She reports current alcohol use of about 2.0 - 3.0 standard drinks of alcohol per week. She reports that she does not use drugs.  Current Meds  Medication Sig   ALPRAZolam  (XANAX ) 0.5 MG tablet Take 1 tablet (0.5 mg total) by mouth daily as needed for anxiety.   Biotin 89999 MCG TABS Take by mouth.   cetirizine (ZYRTEC) 10 MG tablet Take 10 mg by mouth daily.   cholecalciferol (VITAMIN D ) 1000 units tablet Take 1,000 Units by mouth daily.   Cyanocobalamin (VITAMIN B 12 PO) Take by mouth.   cyclobenzaprine  (FLEXERIL ) 10 MG tablet Take 1 tablet (10 mg total) by mouth at bedtime as needed for muscle spasms.   escitalopram  (LEXAPRO ) 10 MG tablet Take 1 tablet (10 mg total) by mouth daily.   gabapentin  (NEURONTIN ) 300 MG capsule TAKE ONE CAPSULE BY  MOUTH AT BEDTIME   ibuprofen  (ADVIL ) 800 MG tablet Take 1 tablet (800 mg total) by mouth every 8 (eight) hours as needed.   metoprolol  succinate (TOPROL -XL) 100 MG 24 hr tablet Take 1 tablet (100 mg total) by mouth daily.   traMADol  (ULTRAM ) 50 MG tablet Take 1 tablet (50 mg total) by mouth every 6 (six) hours as needed.   traZODone  (DESYREL ) 100 MG tablet Take 1 tablet (100 mg total) by mouth at bedtime.    Review of Systems: Cardiovascular: negative for chest pain, palpitations, leg swelling, orthopnea Respiratory: negative for SOB, wheezing or persistent cough Gastrointestinal: negative for abdominal pain Genitourinary: negative for dysuria or gross hematuria  Objective  Vitals: BP 124/78 Comment: by home readings  Pulse 84   Temp 97.7 F (36.5 C)   Ht 5' 3 (1.6 m)   Wt 188 lb 3.2 oz (85.4 kg)   LMP 01/06/2014 (Approximate)   SpO2 96%   BMI 33.34 kg/m  General: no acute distress  Psych:  Alert and oriented, normal mood and affect HEENT:  Normocephalic, atraumatic, supple neck  Cardiovascular:  RRR without murmur. no edema Respiratory:  Good breath sounds bilaterally, CTAB with normal  respiratory effort Skin:  Warm, no rashes Neurologic:   Mental status is normal Commons side effects, risks, benefits, and alternatives for medications and treatment plan prescribed today were discussed, and the patient expressed understanding of the given instructions. Patient is instructed to call or message via MyChart if he/she has any questions or concerns regarding our treatment plan. No barriers to understanding were identified. We discussed Red Flag symptoms and signs in detail. Patient expressed understanding regarding what to do in case of urgent or emergency type symptoms.  Medication list was reconciled, printed and provided to the patient in AVS. Patient instructions and summary information was reviewed with the patient as documented in the AVS. This note was prepared with assistance of Dragon voice recognition software. Occasional wrong-word or sound-a-like substitutions may have occurred due to the inherent limitation

## 2024-05-05 NOTE — Patient Instructions (Signed)
 Please return in December for your annual complete physical; please come fasting. For follow up on chronic medical conditions   If you have any questions or concerns, please don't hesitate to send me a message via MyChart or call the office at [phone]. Thank you for visiting with us  today! It's our pleasure caring for you.    VISIT SUMMARY: Today, we reviewed your current health status, including your blood pressure, weight management, and mental health. We discussed your medications and lifestyle habits, and made some recommendations to help you manage your conditions more effectively.  YOUR PLAN: -HYPERTENSION WITH LOWER EXTREMITY EDEMA AND PALPITATIONS: Hypertension means high blood pressure, which can lead to other health problems if not managed well. Your blood pressure readings are not at the target level, and you have some swelling in your ankles. We recommend monitoring your blood pressure at home for several weeks. If your heart rate remains high, we may consider increasing your Toprol  dosage. If your blood pressure stays elevated or the swelling continues, we might add a diuretic to help reduce the fluid buildup.  -OBESITY AND INSULIN RESISTANCE: Obesity and insulin resistance make it difficult to lose weight and can lead to other health issues. Your current diet is high in meat and potatoes and low in vegetables. We encourage you to incorporate more vegetables, fibers, nuts, berries, fruits and grains into your diet and explore whole foods and plant-based recipes. Additionally, we recommend starting strength training exercises to complement your regular walking routine. Take a walk [place] just to explore other food options.   -MAJOR DEPRESSIVE DISORDER: Major depressive disorder is a mental health condition characterized by persistent feelings of sadness and loss of interest. Your mood has stabilized with the increased dosage of escitalopram , and your sleep is adequate with the  use of trazodone . Continue with your current medications as prescribed.  -GENERAL HEALTH MAINTENANCE: You are seven months overdue for a mammogram, which is an important screening tool for breast cancer. Please schedule a mammogram as soon as possible.  INSTRUCTIONS: Please monitor your blood pressure at home for several weeks and record the readings. Schedule a mammogram as soon as possible. Consider incorporating more vegetables, fibers, and grains into your diet, and start strength training exercises. Continue with your current medications as prescribed.                      Contains text generated by Abridge.                                 Contains text generated by Abridge.

## 2024-09-07 ENCOUNTER — Encounter: Payer: Self-pay | Admitting: Family

## 2024-09-07 ENCOUNTER — Ambulatory Visit (INDEPENDENT_AMBULATORY_CARE_PROVIDER_SITE_OTHER): Admitting: Family

## 2024-09-07 VITALS — BP 130/82 | HR 91 | Temp 97.3°F | Ht 63.0 in | Wt 190.4 lb

## 2024-09-07 DIAGNOSIS — J069 Acute upper respiratory infection, unspecified: Secondary | ICD-10-CM | POA: Diagnosis not present

## 2024-09-07 MED ORDER — AZITHROMYCIN 250 MG PO TABS: ORAL_TABLET | ORAL | 0 refills | Status: AC

## 2024-09-07 MED ORDER — GUAIFENESIN-CODEINE 100-10 MG/5ML PO SOLN: 5.0000 mL | Freq: Three times a day (TID) | ORAL | 0 refills | Status: DC | PRN

## 2024-09-07 NOTE — Progress Notes (Signed)
 Patient ID: Jeanne Parks, female    DOB: 04/14/1961, 63 y.o.   MRN: [id number]  Chief Complaint  Patient presents with  . Cough    Pt c/o Cough with green mucus and nasal congestion for over 1 week. Has tried mucinex, which did help slightly.   Discussed the use of AI scribe software for clinical note transcription with the patient, who gave verbal consent to proceed.  History of Present Illness Jeanne Parks is a 63 year old female who presents with a persistent cough and congestion for over a week.  She reports a week of persistent cough with green mucus and nasal congestion with occasional yellow discharge. She feels mucus in her throat that triggers coughing. Mucinex has not helped. She has no sore throat, fever, chills, ear pain, shortness of breath, or chest tightness except during coughing spells. The cough disturbs her sleep and she keeps tissues by her bed. She has had pneumonia before and is worried about recurrence. She received a pneumonia vaccine in 2020.  Assessment & Plan Acute upper respiratory infection Symptoms suggest viral or bacterial etiology, with concern for bacterial superinfection. No signs of pneumonia or asthma, lungs clear on exam. - Prescribed azithromycin  (Zpack) for 5 days. - Prescribed cough syrup with codeine for nighttime relief, ok during day if not driving. - Advised increased water intake. - Recommended avoiding dairy products until cough improved. - Discussed potential for lingering cough but expected daily improvement, call office if not better after zpack finished. - Advised follow-up for potential Prevnar 20 vaccination.   Subjective:    Outpatient Medications Prior to Visit  Medication Sig Dispense Refill  . ALPRAZolam  (XANAX ) 0.5 MG tablet Take 1 tablet (0.5 mg total) by mouth daily as needed for anxiety. 20 tablet 0  . Biotin 89999 MCG TABS Take by mouth.    . cetirizine (ZYRTEC) 10 MG tablet Take 10 mg by mouth daily.    .  cholecalciferol (VITAMIN D ) 1000 units tablet Take 1,000 Units by mouth daily.    . Cyanocobalamin (VITAMIN B 12 PO) Take by mouth.    . cyclobenzaprine  (FLEXERIL ) 10 MG tablet Take 1 tablet (10 mg total) by mouth at bedtime as needed for muscle spasms. 30 tablet 0  . escitalopram  (LEXAPRO ) 10 MG tablet Take 1 tablet (10 mg total) by mouth daily.    . gabapentin  (NEURONTIN ) 300 MG capsule TAKE ONE CAPSULE BY MOUTH AT BEDTIME 90 capsule 1  . ibuprofen  (ADVIL ) 800 MG tablet Take 1 tablet (800 mg total) by mouth every 8 (eight) hours as needed. 30 tablet 0  . metoprolol  succinate (TOPROL -XL) 100 MG 24 hr tablet Take 1 tablet (100 mg total) by mouth daily. 90 tablet 3  . traMADol  (ULTRAM ) 50 MG tablet Take 1 tablet (50 mg total) by mouth every 6 (six) hours as needed. 30 tablet 1  . traZODone  (DESYREL ) 100 MG tablet Take 1 tablet (100 mg total) by mouth at bedtime. 90 tablet 3   No facility-administered medications prior to visit.   Past Medical History:  Diagnosis Date  . Allergy    environmental and seasonal  . Anxiety   . DJD (degenerative joint disease), lumbar 06/06/2020   X-rays August 2021  . History of chicken pox    as child  . HLD (hyperlipidemia)   . HSV-1 infection   . Hypertension   . OA (osteoarthritis) of right knee, s/p arthroscopy   . PMB (postmenopausal bleeding)   . Seasonal allergies   .  Skin cancer    chest and back years ago basal cell carcinoma  . Tachycardia 12/21/2021   managed by pcp  . Tooth size and form abnormality 12/21/2021   1 lower front tooth root dying per pt  . Wears glasses for reading    Past Surgical History:  Procedure Laterality Date  . ABDOMINAL HYSTERECTOMY  01/2022  . BREAST CYST EXCISION Left 10+ yrs ago  . CESAREAN SECTION  08/27/1986  . colonscopy  2021  . CYSTOSCOPY N/A 01/29/2022   Procedure: CYSTOSCOPY;  Surgeon: Jannis Kate Norris, MD;  Location: [facility];  Service: Gynecology;  Laterality: N/A;  .  DILATATION & CURETTAGE/HYSTEROSCOPY WITH MYOSURE N/A 12/26/2021   Procedure: DILATATION & CURETTAGE/HYSTEROSCOPY WITH MYOSURE;  Surgeon: Jannis Kate Norris, MD;  Location: [facility];  Service: Gynecology;  Laterality: N/A;  . DILATION AND CURETTAGE OF UTERUS  1997   Miscarriage  . KNEE ARTHROSCOPY Right 02/2015  . TONSILLECTOMY  1967  . TOTAL LAPAROSCOPIC HYSTERECTOMY WITH BILATERAL SALPINGO OOPHORECTOMY Bilateral 01/29/2022   Procedure: TOTAL LAPAROSCOPIC HYSTERECTOMY WITH BILATERAL SALPINGO OOPHORECTOMY;  Surgeon: Jertson, Jill Evelyn, MD;  Location: [facility];  Service: Gynecology;  Laterality: Bilateral;   No Known Allergies    Objective:    Physical Exam Vitals and nursing note reviewed.  Constitutional:      Appearance: Normal appearance. She is ill-appearing.     Interventions: Face mask in place.  HENT:     Right Ear: Tympanic membrane and ear canal normal.     Left Ear: Tympanic membrane and ear canal normal.     Nose:     Right Nostril: Septal hematoma present.     Right Sinus: Frontal sinus tenderness (pressure) present.     Left Sinus: Frontal sinus tenderness (pressure) present.     Mouth/Throat:     Mouth: Mucous membranes are moist.     Pharynx: No pharyngeal swelling, oropharyngeal exudate, posterior oropharyngeal erythema or uvula swelling.     Tonsils: No tonsillar exudate or tonsillar abscesses.  Cardiovascular:     Rate and Rhythm: Normal rate and regular rhythm.  Pulmonary:     Effort: Pulmonary effort is normal.     Breath sounds: Normal breath sounds.  Musculoskeletal:        General: Normal range of motion.  Lymphadenopathy:     Head:     Right side of head: No preauricular or posterior auricular adenopathy.     Left side of head: No preauricular or posterior auricular adenopathy.     Cervical: No cervical adenopathy.  Skin:    General: Skin is warm and dry.  Neurological:     Mental Status: She is alert.   Psychiatric:        Mood and Affect: Mood normal.        Behavior: Behavior normal.    BP 130/82 (BP Location: Left Arm, Patient Position: Sitting, Cuff Size: Large)   Pulse 91   Temp (!) 97.3 F (36.3 C) (Temporal)   Ht 5' 3 (1.6 m)   Wt 190 lb 6.4 oz (86.4 kg)   LMP 01/06/2014 (Approximate)   SpO2 98%   BMI 33.73 kg/m  Wt Readings from Last 3 Encounters:  09/07/24 190 lb 6.4 oz (86.4 kg)  05/05/24 188 lb 3.2 oz (85.4 kg)  02/17/24 190 lb 6.4 oz (86.4 kg)      Lucius Krabbe, NP

## 2024-09-15 ENCOUNTER — Ambulatory Visit: Payer: Self-pay | Admitting: *Deleted

## 2024-09-15 ENCOUNTER — Emergency Department (HOSPITAL_BASED_OUTPATIENT_CLINIC_OR_DEPARTMENT_OTHER)
Admission: EM | Admit: 2024-09-15 | Discharge: 2024-09-15 | Disposition: A | Discharge status: Home / Self Care | Source: Ambulatory Visit | Attending: Emergency Medicine | Admitting: Emergency Medicine

## 2024-09-15 ENCOUNTER — Emergency Department (HOSPITAL_BASED_OUTPATIENT_CLINIC_OR_DEPARTMENT_OTHER): Admitting: Radiology

## 2024-09-15 ENCOUNTER — Encounter (HOSPITAL_BASED_OUTPATIENT_CLINIC_OR_DEPARTMENT_OTHER): Payer: Self-pay | Admitting: Emergency Medicine

## 2024-09-15 ENCOUNTER — Emergency Department (HOSPITAL_BASED_OUTPATIENT_CLINIC_OR_DEPARTMENT_OTHER)

## 2024-09-15 ENCOUNTER — Other Ambulatory Visit: Payer: Self-pay

## 2024-09-15 DIAGNOSIS — M5412 Radiculopathy, cervical region: Secondary | ICD-10-CM

## 2024-09-15 DIAGNOSIS — I10 Essential (primary) hypertension: Secondary | ICD-10-CM | POA: Diagnosis not present

## 2024-09-15 DIAGNOSIS — M549 Dorsalgia, unspecified: Secondary | ICD-10-CM | POA: Diagnosis not present

## 2024-09-15 DIAGNOSIS — Z85828 Personal history of other malignant neoplasm of skin: Secondary | ICD-10-CM | POA: Diagnosis not present

## 2024-09-15 DIAGNOSIS — M546 Pain in thoracic spine: Secondary | ICD-10-CM

## 2024-09-15 DIAGNOSIS — Z79899 Other long term (current) drug therapy: Secondary | ICD-10-CM | POA: Diagnosis not present

## 2024-09-15 LAB — BASIC METABOLIC PANEL WITH GFR
Anion gap: 9 (ref 5–15)
BUN: 11 mg/dL (ref 8–23)
CO2: 28 mmol/L (ref 22–32)
Calcium: 10 mg/dL (ref 8.9–10.3)
Chloride: 107 mmol/L (ref 98–111)
Creatinine, Ser: 0.93 mg/dL (ref 0.44–1.00)
GFR, Estimated: >60 mL/min (ref >60)
Glucose, Bld: 113 mg/dL — ABNORMAL HIGH (ref 70–99)
Potassium: 4 mmol/L (ref 3.5–5.1)
Sodium: 144 mmol/L (ref 135–145)

## 2024-09-15 LAB — CBC
HCT: 38 % (ref 36.0–46.0)
Hemoglobin: 12.5 g/dL (ref 12.0–15.0)
MCH: 31 pg (ref 26.0–34.0)
MCHC: 32.9 g/dL (ref 30.0–36.0)
MCV: 94.3 fL (ref 80.0–100.0)
Platelets: 311 K/uL (ref 150–400)
RBC: 4.03 MIL/uL (ref 3.87–5.11)
RDW: 13.4 % (ref 11.5–15.5)
WBC: 10.4 K/uL (ref 4.0–10.5)
nRBC: 0 % (ref 0.0–0.2)

## 2024-09-15 LAB — TROPONIN T, HIGH SENSITIVITY: Troponin T High Sensitivity: <15 ng/L (ref 0–19)

## 2024-09-15 NOTE — ED Provider Notes (Signed)
 [place] EMERGENCY DEPARTMENT AT [facility] Provider Note   CSN: [id number] Arrival date & time: 09/15/24  9051     Patient presents with: Shoulder Pain   Jeanne Parks is a 63 y.o. female.    Shoulder Pain Patient presents with left shoulder/back pain.  Around for 4 days.  Goes just medial to the scapula but does go down the left arm.  States she has had some trouble using the hand.  However this sounds if it is somewhat chronic.  States she used to be left-handed but over the last years has moved to right hand because she was having more trouble using the left.  Sounds if she has not really been seen for it.  Has a history of sciatica.  No anterior chest pain.  No exertional pain.  No headaches.    Past Medical History:  Diagnosis Date   Allergy    environmental and seasonal   Anxiety    DJD (degenerative joint disease), lumbar 06/06/2020   X-rays August 2021   History of chicken pox    as child   HLD (hyperlipidemia)    HSV-1 infection    Hypertension    OA (osteoarthritis) of right knee, s/p arthroscopy    PMB (postmenopausal bleeding)    Seasonal allergies    Skin cancer    chest and back years ago basal cell carcinoma   Tachycardia 12/21/2021   managed by pcp   Tooth size and form abnormality 12/21/2021   1 lower front tooth root dying per pt   Wears glasses for reading     Prior to Admission medications   Medication Sig Start Date End Date Taking? Authorizing Provider  ALPRAZolam  (XANAX ) 0.5 MG tablet Take 1 tablet (0.5 mg total) by mouth daily as needed for anxiety. 02/17/24   Jodie Lavern CROME, MD  Biotin 89999 MCG TABS Take by mouth.    [provider]  cetirizine (ZYRTEC) 10 MG tablet Take 10 mg by mouth daily.    [provider]  cholecalciferol (VITAMIN D ) 1000 units tablet Take 1,000 Units by mouth daily.    [provider]  Cyanocobalamin (VITAMIN B 12 PO) Take by mouth.    [provider]  cyclobenzaprine   (FLEXERIL ) 10 MG tablet Take 1 tablet (10 mg total) by mouth at bedtime as needed for muscle spasms. 03/14/22   Jodie Lavern CROME, MD  escitalopram  (LEXAPRO ) 10 MG tablet Take 1 tablet (10 mg total) by mouth daily. 02/17/24   Jodie Lavern CROME, MD  gabapentin  (NEURONTIN ) 300 MG capsule TAKE ONE CAPSULE BY MOUTH AT BEDTIME 04/29/24   Jodie Lavern CROME, MD  guaiFENesin -codeine  100-10 MG/5ML syrup Take 5 mLs by mouth 3 (three) times daily as needed for cough (Use at bedtime mainly for sleep, ok during the day if not driving.). 87/8/74   Hudnell, Corean, NP  ibuprofen  (ADVIL ) 800 MG tablet Take 1 tablet (800 mg total) by mouth every 8 (eight) hours as needed. 01/29/22   Jertson, Jill Evelyn, MD  metoprolol  succinate (TOPROL -XL) 100 MG 24 hr tablet Take 1 tablet (100 mg total) by mouth daily. 09/17/23   Jodie Lavern CROME, MD  traMADol  (ULTRAM ) 50 MG tablet Take 1 tablet (50 mg total) by mouth every 6 (six) hours as needed. 04/29/24   Webb, Padonda B, FNP  traZODone  (DESYREL ) 100 MG tablet Take 1 tablet (100 mg total) by mouth at bedtime. 09/17/23   Jodie Lavern CROME, MD    Allergies: Patient has no  known allergies.    Review of Systems  Updated Vital Signs BP 119/60   Pulse 78   Temp 97.6 F (36.4 C) (Oral)   Resp 19   LMP 01/06/2014 (Approximate)   SpO2 97%   Physical Exam Vitals and nursing note reviewed.  Cardiovascular:     Rate and Rhythm: Regular rhythm.  Chest:     Chest wall: No tenderness.  Abdominal:     Tenderness: There is no abdominal tenderness.  Musculoskeletal:        General: Tenderness present.     Comments: Tenderness just medial to the scapula on the left back.  No rash.  Good range of motion shoulder.  Radial pulse intact.  Neurological:     Mental Status: She is alert.     Comments: Overall sensation grossly intact over radial median ulnar distribution of the left hand.  Good flexion and extension at the elbow.  Good thumbs up and okay sign but states she has a little  difficulty crossing the fingers although they do appear to cross.  Does have some decrease strength holding fingers apart from each other on the left hand compared to the right.     (all labs ordered are listed, but only abnormal results are displayed) Labs Reviewed  BASIC METABOLIC PANEL WITH GFR - Abnormal; Notable for the following components:      Result Value   Glucose, Bld 113 (*)    All other components within normal limits  CBC  TROPONIN T, HIGH SENSITIVITY    EKG: EKG Interpretation Date/Time:  Tuesday September 15 2024 10:56:33 EST Ventricular Rate:  108 PR Interval:  139 QRS Duration:  114 QT Interval:  381 QTC Calculation: 514 R Axis:   86  Text Interpretation: Sinus tachycardia Probable left atrial enlargement Borderline intraventricular conduction delay Borderline repol abnormality, diffuse leads Prolonged QT interval Confirmed by Patsey Lot [phone]) on 09/15/2024 11:00:09 AM  Radiology: [facility] Chest 2 View Result Date: 09/15/2024 EXAM: 2 VIEW(S) XRAY OF THE CHEST 09/15/2024 10:47:00 AM COMPARISON: None available. CLINICAL HISTORY: back pain FINDINGS: LUNGS AND PLEURA: No focal pulmonary opacity. No pleural effusion. No pneumothorax. HEART AND MEDIASTINUM: Small hiatal hernia. No acute abnormality of the cardiac silhouette. BONES AND SOFT TISSUES: Mild multilevel degenerative changes of thoracic spine. IMPRESSION: 1. No acute findings. 2. Small hiatal hernia. Electronically signed by: Evalene Coho MD 09/15/2024 11:16 AM EST RP Workstation: [id number]   CT Cervical Spine Wo Contrast Result Date: 09/15/2024 EXAM: CT CERVICAL SPINE WITHOUT CONTRAST 09/15/2024 10:40:27 AM TECHNIQUE: CT of the cervical spine was performed without the administration of intravenous contrast. Multiplanar reformatted images are provided for review. Automated exposure control, iterative reconstruction, and/or weight based adjustment of the mA/kV was utilized to reduce the radiation dose to as  low as reasonably achievable. COMPARISON: None available. CLINICAL HISTORY: Cervical radiculopathy, no red flags. FINDINGS: CERVICAL SPINE: BONES AND ALIGNMENT: No acute fracture or traumatic malalignment. There is slight degenerative anterolisthesis at C3-C4 and C4-C5. There is reversal of the normal cervical lordosis. DEGENERATIVE CHANGES: There is moderate left uncovertebral joint hypertrophy at C2-C3, causing mild left neural foraminal stenosis. At C6-C7, there is a bulging disc osteophyte complex present, which is resulting in mild-to-moderate central spinal canal stenosis and bilateral neural foraminal stenosis, worse on the right. SOFT TISSUES: No prevertebral soft tissue swelling. IMPRESSION: 1. Bulging disc osteophyte complex at C6-7, resulting in mild-to-moderate central spinal canal stenosis and bilateral neural foraminal stenosis, worse on the right. 2. Moderate left uncovertebral joint  hypertrophy at C2-3, causing mild left neural foraminal stenosis. 3. Slight degenerative anterolisthesis at C3-4 and C4-5. Electronically signed by: Evalene Coho MD 09/15/2024 10:49 AM EST RP Workstation: [id number]     Procedures   Medications Ordered in the ED - No data to display                                  Medical Decision Making Amount and/or Complexity of Data Reviewed Labs: ordered. Radiology: ordered.   Patient with some back pain with some neurologic findings in left hand.  Not tearing pain.  Doubt dissection but will get EKG for similar cardiac workup.  Will get chest x-ray.  Also with radicular type symptoms down the hand we will get CT of the cervical spine.  Cardiac workup reassuring.  CT scan does show some chronic changes in the cervical spine.  Will need neurosurgery follow-up since has had neurodeficits with it although it has been somewhat chronic with that. Chest x-ray reassuring.  Doubt causes such as aortic dissection.  Appears stable for discharge home.       Final  diagnoses:  Acute left-sided thoracic back pain  Cervical radiculopathy    ED Discharge Orders     None          Patsey Lot, MD 09/15/24 1437

## 2024-09-15 NOTE — ED Triage Notes (Signed)
 Reports left shoulder pain x 4 day.  Reports pain has gradually worsen. Heat helps relieve pain.

## 2024-09-15 NOTE — ED Notes (Signed)
 DC paperwork given and verbally understood.

## 2024-09-15 NOTE — Telephone Encounter (Signed)
 Please see triage note for patient; pt advised to report to ED

## 2024-09-15 NOTE — Telephone Encounter (Signed)
 FYI Only or Action Required?: FYI only for provider: ED advised.  Patient was last seen in primary care on 09/07/2024 by Lucius Krabbe, NP.  Called Nurse Triage reporting Shoulder Pain.  Symptoms began several days ago.  Interventions attempted: Nothing.  Symptoms are: unchanged.  Triage Disposition: Go to ED Now (Notify PCP)  Patient/caregiver understands and will follow disposition?: Unsure  Copied from CRM [id number]. Topic: Clinical - Red Word Triage >> Sep 15, 2024  9:09 AM Rea BROCKS wrote: Red Word that prompted transfer to Nurse Triage: Did something to left shoulder and the pain is starting to radiate down her arm Reason for Disposition  [1] Age > 40 AND [2] no obvious cause AND [3] pain even when not moving the arm  (Exception: Pain is clearly made worse by moving arm or bending neck.)  Answer Assessment - Initial Assessment Questions 1. ONSET: When did the pain start?     Started few days ago, 3-4 2. LOCATION: Where is the pain located?     Sore spot on left arm-with pain radiating down- effecting grip 3. PAIN: How bad is the pain? (Scale 1-10; or mild, moderate, severe)     5/10 4. WORK OR EXERCISE: Has there been any recent work or exercise that involved this part of the body?     Normal home activity 5. CAUSE: What do you think is causing the shoulder pain?     unsure 6. OTHER SYMPTOMS: Do you have any other symptoms? (e.g., neck pain, swelling, rash, fever, numbness, weakness)     Weak grip  Protocols used: Shoulder Pain-A-AH

## 2024-09-17 ENCOUNTER — Encounter: Payer: Self-pay | Admitting: Family Medicine

## 2024-09-17 ENCOUNTER — Ambulatory Visit (INDEPENDENT_AMBULATORY_CARE_PROVIDER_SITE_OTHER): Admitting: Family Medicine

## 2024-09-17 ENCOUNTER — Ambulatory Visit: Payer: Self-pay

## 2024-09-17 VITALS — BP 132/80 | HR 86 | Temp 98.9°F | Ht 63.0 in | Wt 190.0 lb

## 2024-09-17 DIAGNOSIS — M5412 Radiculopathy, cervical region: Secondary | ICD-10-CM

## 2024-09-17 DIAGNOSIS — M62830 Muscle spasm of back: Secondary | ICD-10-CM

## 2024-09-17 MED ORDER — METHOCARBAMOL 500 MG PO TABS: 500.0000 mg | ORAL_TABLET | Freq: Three times a day (TID) | ORAL | 0 refills | Status: DC | PRN

## 2024-09-17 MED ORDER — MELOXICAM 15 MG PO TABS: 15.0000 mg | ORAL_TABLET | Freq: Every day | ORAL | 0 refills | Status: DC

## 2024-09-17 MED ORDER — KETOROLAC TROMETHAMINE 60 MG/2ML IM SOLN: 60.0000 mg | Freq: Once | INTRAMUSCULAR | Status: DC

## 2024-09-17 NOTE — Telephone Encounter (Signed)
 Pt scheduled today at regional location d/t severity of pain and not d/c from ED with home tx.   FYI Only or Action Required?: FYI only for provider: appointment scheduled on 09/17/24.  Patient was last seen in primary care on 09/07/2024 by Jeanne Krabbe, NP.  Called Nurse Triage reporting Shoulder Pain.  Symptoms began several days ago.  Interventions attempted: OTC medications: advil .  Symptoms are: unchanged.  Triage Disposition: See PCP Within 2 Weeks  Patient/caregiver understands and will follow disposition?:  Reason for Disposition  [1] MILD pain (e.g., does not interfere with normal activities) AND [2] present > 7 days  Answer Assessment - Initial Assessment Questions In ED on 09/15/24, pt was told the pain was d/t cervical spinal compression. Reports no improvement since 09/15/24 and it is effecting her ability to sleep. Was not discharged with medication. Pain described as 8/10. States only minimal  improvement with advil .  1. ONSET: When did the pain start?     09/14/24 2. LOCATION: Where is the pain located?     Left shoulder, radiates down upper arm 3. PAIN: How bad is the pain? (Scale 1-10; or mild, moderate, severe)     8/10 4. WORK OR EXERCISE: Has there been any recent work or exercise that involved this part of the body?     Denies 5. CAUSE: What do you think is causing the shoulder pain?     Cervical spine 6. OTHER SYMPTOMS: Do you have any other symptoms? (e.g., neck pain, swelling, rash, fever, numbness, weakness)     Denies  Protocols used: Shoulder Pain-A-AH Copied from CRM #[id number]. Topic: Clinical - Red Word Triage >> Sep 17, 2024  8:41 AM Antony RAMAN wrote: Red Word that prompted transfer to Nurse Triage: in so much pain in shoulder still, went to er on 12/9 for this

## 2024-09-17 NOTE — Patient Instructions (Addendum)
 You received a Toradol  60 mg IM injection in the office today.  Avoid Advil  or any other NSAIDs until tomorrow.  I prescribed meloxicam which is an NSAID for you to start taking tomorrow once daily.  Avoid Advil  or other anti-inflammatory pain medicines while taking this prescription.   If needed, you can take Tylenol  500 mg or 1000 mg this evening.  I also recommend using a heating pad and topical pain medicine if needed such as Salonpas with lidocaine  or lidocaine  patches.  You may take the muscle relaxant but avoid alcohol, driving or other sedating medications such as alprazolam  or tramadol .  Follow-up with your primary care provider as scheduled next week

## 2024-09-17 NOTE — Progress Notes (Signed)
 Subjective:     Patient ID: Jeanne Parks, female    DOB: 1960/11/05, 63 y.o.   MRN: [id number]  Chief Complaint  Patient presents with   Follow-up    HOSPITAL FOLLOW UP Left Shoulder pain      HPI  Discussed the use of AI scribe software for clinical note transcription with the patient, who gave verbal consent to proceed.  History of Present Illness Jeanne Parks is a 63 year old female who presents with shoulder pain.  Left shoulder and arm pain - Constant throbbing, aching pain in the left shoulder radiating down the back of the arm - Pain worsens with movement, especially when raising the arm - Limits hand function - No prior similar pain - No recent shoulder injury - Pressing over the shoulder slightly relieves the pain - Breathing no longer worsens the pain - no numbness, tingling or weakness  Recent emergency department evaluation - Evaluated in the emergency room two days ago - Myocardial infarction was ruled out  Medication use and pain management - Takes gabapentin  and trazodone  at night - Took tramadol  this morning and over-the-counter Advil  without pain relief  Cervical spine imaging findings - Recent CT cervical spine without contrast showed bulging disc and osteophyte at C6-C7 - Moderate central spinal canal stenosis and bilateral neural foraminal stenosis, worse on the right - Pain is mainly on the left     There are no preventive care reminders to display for this patient.  Past Medical History:  Diagnosis Date   Allergy    environmental and seasonal   Anxiety    DJD (degenerative joint disease), lumbar 06/06/2020   X-rays August 2021   History of chicken pox    as child   HLD (hyperlipidemia)    HSV-1 infection    Hypertension    OA (osteoarthritis) of right knee, s/p arthroscopy    PMB (postmenopausal bleeding)    Seasonal allergies    Skin cancer    chest and back years ago basal cell carcinoma   Tachycardia 12/21/2021   managed  by pcp   Tooth size and form abnormality 12/21/2021   1 lower front tooth root dying per pt   Wears glasses for reading     Past Surgical History:  Procedure Laterality Date   ABDOMINAL HYSTERECTOMY  01/2022   BREAST CYST EXCISION Left 10+ yrs ago   CESAREAN SECTION  08/27/1986   colonscopy  2021   CYSTOSCOPY N/A 01/29/2022   Procedure: CYSTOSCOPY;  Surgeon: Jannis Kate Norris, MD;  Location: [facility];  Service: Gynecology;  Laterality: N/A;   DILATATION & CURETTAGE/HYSTEROSCOPY WITH MYOSURE N/A 12/26/2021   Procedure: DILATATION & CURETTAGE/HYSTEROSCOPY WITH MYOSURE;  Surgeon: Jannis Kate Norris, MD;  Location: [facility] [place];  Service: Gynecology;  Laterality: N/A;   DILATION AND CURETTAGE OF UTERUS  1997   Miscarriage   KNEE ARTHROSCOPY Right 02/2015   TONSILLECTOMY  1967   TOTAL LAPAROSCOPIC HYSTERECTOMY WITH BILATERAL SALPINGO OOPHORECTOMY Bilateral 01/29/2022   Procedure: TOTAL LAPAROSCOPIC HYSTERECTOMY WITH BILATERAL SALPINGO OOPHORECTOMY;  Surgeon: Jertson, Jill Evelyn, MD;  Location: [facility] [place];  Service: Gynecology;  Laterality: Bilateral;    Family History  Problem Relation Age of Onset   Breast cancer Mother 57       lumpectomy and radiation; estrogen fed   Hypertension Mother    Colon polyps Father    Heart disease Father    Diabetes type I Sister 15   High  Cholesterol Sister    Lung cancer Maternal Aunt    Cancer Paternal Uncle    Colon cancer Neg Hx    Esophageal cancer Neg Hx    Stomach cancer Neg Hx    Rectal cancer Neg Hx     Social History   Socioeconomic History   Marital status: Widowed    Spouse name: Not on file   Number of children: 1   Years of education: Not on file   Highest education level: 12th grade  Occupational History   Occupation: Lexicographer: [employer]  Tobacco Use   Smoking status: Never    Passive exposure: Past (as a child)   Smokeless  tobacco: Never  Vaping Use   Vaping status: Never Used  Substance and Sexual Activity   Alcohol use: Yes    Alcohol/week: 2.0 - 3.0 standard drinks of alcohol    Types: 2 - 3 Standard drinks or equivalent per week    Comment: 1 drink per day   Drug use: No   Sexual activity: Yes    Partners: Male    Birth control/protection: Post-menopausal  Other Topics Concern   Not on file  Social History Narrative   Husband with failing kidney and not candidate for transplant. Passed April 11 2023.   Social Drivers of Health   Tobacco Use: Low Risk (09/17/2024)   Patient History    Smoking Tobacco Use: Never    Smokeless Tobacco Use: Never    Passive Exposure: Past  Financial Resource Strain: Low Risk (05/04/2024)   Overall Financial Resource Strain (CARDIA)    Difficulty of Paying Living Expenses: Not hard at all  Food Insecurity: No Food Insecurity (05/04/2024)   Epic    Worried About Radiation Protection Practitioner of Food in the Last Year: Never true    Ran Out of Food in the Last Year: Never true  Transportation Needs: No Transportation Needs (05/04/2024)   Epic    Lack of Transportation (Medical): No    Lack of Transportation (Non-Medical): No  Physical Activity: Insufficiently Active (05/04/2024)   Exercise Vital Sign    Days of Exercise per Week: 7 days    Minutes of Exercise per Session: 20 min  Stress: No Stress Concern Present (05/04/2024)   [employer] of Occupational Health - Occupational Stress Questionnaire    Feeling of Stress: Only a little  Social Connections: Moderately Isolated (05/04/2024)   Social Connection and Isolation Panel    Frequency of Communication with Friends and Family: More than three times a week    Frequency of Social Gatherings with Friends and Family: More than three times a week    Attends Religious Services: 1 to 4 times per year    Active Member of [employer] or Organizations: No    Attends Banker Meetings: Not on file    Marital Status: Widowed   Intimate Partner Violence: Not on file  Depression (PHQ2-9): Low Risk (05/05/2024)   Depression (PHQ2-9)    PHQ-2 Score: 0  Alcohol Screen: Low Risk (05/04/2024)   Alcohol Screen    Last Alcohol Screening Score (AUDIT): 4  Housing: Low Risk (05/04/2024)   Epic    Unable to Pay for Housing in the Last Year: No    Number of Times Moved in the Last Year: 0    Homeless in the Last Year: No  Utilities: Not on file  Health Literacy: Not on file    Outpatient Medications Prior to  Visit  Medication Sig Dispense Refill   ALPRAZolam  (XANAX ) 0.5 MG tablet Take 1 tablet (0.5 mg total) by mouth daily as needed for anxiety. 20 tablet 0   Biotin 89999 MCG TABS Take by mouth.     cetirizine (ZYRTEC) 10 MG tablet Take 10 mg by mouth daily.     cholecalciferol (VITAMIN D ) 1000 units tablet Take 1,000 Units by mouth daily.     Cyanocobalamin (VITAMIN B 12 PO) Take by mouth.     escitalopram  (LEXAPRO ) 10 MG tablet Take 1 tablet (10 mg total) by mouth daily.     gabapentin  (NEURONTIN ) 300 MG capsule TAKE ONE CAPSULE BY MOUTH AT BEDTIME 90 capsule 1   metoprolol  succinate (TOPROL -XL) 100 MG 24 hr tablet Take 1 tablet (100 mg total) by mouth daily. 90 tablet 3   traMADol  (ULTRAM ) 50 MG tablet Take 1 tablet (50 mg total) by mouth every 6 (six) hours as needed. 30 tablet 1   traZODone  (DESYREL ) 100 MG tablet Take 1 tablet (100 mg total) by mouth at bedtime. 90 tablet 3   cyclobenzaprine  (FLEXERIL ) 10 MG tablet Take 1 tablet (10 mg total) by mouth at bedtime as needed for muscle spasms. 30 tablet 0   guaiFENesin -codeine  100-10 MG/5ML syrup Take 5 mLs by mouth 3 (three) times daily as needed for cough (Use at bedtime mainly for sleep, ok during the day if not driving.). 120 mL 0   ibuprofen  (ADVIL ) 800 MG tablet Take 1 tablet (800 mg total) by mouth every 8 (eight) hours as needed. 30 tablet 0   No facility-administered medications prior to visit.    Allergies[1]  Review of Systems  Constitutional:   Negative for chills and fever.  Respiratory:  Negative for shortness of breath.   Cardiovascular:  Negative for chest pain, palpitations and leg swelling.  Gastrointestinal:  Negative for nausea and vomiting.  Musculoskeletal:  Positive for myalgias and neck pain.  Neurological:  Negative for dizziness, tingling, focal weakness and headaches.       Objective:    Physical Exam Constitutional:      General: She is not in acute distress.    Appearance: She is not ill-appearing.  Eyes:     Extraocular Movements: Extraocular movements intact.     Conjunctiva/sclera: Conjunctivae normal.  Cardiovascular:     Rate and Rhythm: Normal rate.  Pulmonary:     Effort: Pulmonary effort is normal.  Musculoskeletal:     Left upper arm: Normal.     Right hand: Normal strength. Normal sensation. Normal capillary refill. Normal pulse.     Left hand: Normal strength. Normal sensation. Normal capillary refill. Normal pulse.     Cervical back: Neck supple. Spasms present. No rigidity. Pain with movement present. Decreased range of motion.     Thoracic back: Normal.     Comments: Spasm of left upper trapezius. Bilateral upper extremities with normal ROM and strength. Negative drop arm   Skin:    General: Skin is warm and dry.  Neurological:     General: No focal deficit present.     Mental Status: She is alert and oriented to person, place, and time.  Psychiatric:        Mood and Affect: Mood normal.        Behavior: Behavior normal.        Thought Content: Thought content normal.      BP 132/80 (BP Location: Right Arm, Patient Position: Sitting)   Pulse 86   Temp 98.9 F (  37.2 C)   Ht 5' 3 (1.6 m)   Wt 190 lb (86.2 kg)   LMP 01/06/2014   SpO2 97%   BMI 33.66 kg/m  Wt Readings from Last 3 Encounters:  09/17/24 190 lb (86.2 kg)  09/07/24 190 lb 6.4 oz (86.4 kg)  05/05/24 188 lb 3.2 oz (85.4 kg)       Assessment & Plan:   Problem List Items Addressed This Visit   None Visit  Diagnoses       Spasm of left trapezius muscle    -  Primary   Relevant Medications   ketorolac  (TORADOL ) injection 60 mg (Start on 09/17/2024  2:30 PM)     Cervical radiculopathy       Relevant Medications   methocarbamol (ROBAXIN) 500 MG tablet   ketorolac  (TORADOL ) injection 60 mg (Start on 09/17/2024  2:30 PM)       Assessment and Plan Assessment & Plan Cervical radiculopathy with muscle spasm and spinal stenosis - No red flag symptoms.  Chronic cervical radiculopathy with muscle spasm and spinal stenosis, primarily affecting the left shoulder and radiating down the arm. Pain is constant and aching, exacerbated by movement. CT cervical spine shows bulging disc osteophyte at C6-C7 with moderate central spinal canal stenosis and bilateral neural foraminal stenosis, worse on the right. Current pain management with tramadol  and Advil  is ineffective. Gabapentin  is continued. Consideration of muscle relaxant therapy due to muscle tightness and spasm. Discussed potential for sports medicine intervention or neurosurgery but will check with PCP next week.  - Administered Toradol  60 mg IM injection in the office. - Prescribed muscle relaxant (Robaxin) as needed, avoiding concurrent use with alprazolam , tramadol , and alcohol. - Hold off on oral NSAIDs until tomorrow; may take Tylenol  today if needed. - Meloxicam prescribed. Avoid other NSAIDs while taking this medication.  - Use a heating pad and topical pain medicine such as Salonpas with lidocaine . - Follow up with PCP as scheduled next week.      I have discontinued Iyla Balzarini. Vinas's ibuprofen , cyclobenzaprine , and guaiFENesin -codeine . I am also having her start on methocarbamol and meloxicam. Additionally, I am having her maintain her cholecalciferol, Cyanocobalamin (VITAMIN B 12 PO), Biotin, cetirizine, traZODone , metoprolol  succinate, escitalopram , ALPRAZolam , traMADol , and gabapentin . We will continue to administer ketorolac .  Meds  ordered this encounter  Medications   methocarbamol (ROBAXIN) 500 MG tablet    Sig: Take 1 tablet (500 mg total) by mouth every 8 (eight) hours as needed for muscle spasms.    Dispense:  30 tablet    Refill:  0    Supervising Provider:   ROLLENE NORRIS A [[id number]]   ketorolac  (TORADOL ) injection 60 mg   meloxicam (MOBIC) 15 MG tablet    Sig: Take 1 tablet (15 mg total) by mouth daily.    Dispense:  30 tablet    Refill:  0    Supervising Provider:   ROLLENE NORRIS A [[id number]]       [1] No Known Allergies

## 2024-09-18 ENCOUNTER — Encounter: Admitting: Family Medicine

## 2024-09-18 ENCOUNTER — Telehealth: Payer: Self-pay

## 2024-09-18 NOTE — Telephone Encounter (Signed)
 Copied from CRM #[id number]. Topic: Clinical - Medication Question >> Sep 18, 2024  8:22 AM Emylou G wrote: Reason for CRM: Was seen for shoulder and arm yesterday.. medication that was prescribed isn't working.. can she get pain medication?  Pls review/call patient

## 2024-09-22 ENCOUNTER — Ambulatory Visit: Payer: Self-pay | Admitting: Family Medicine

## 2024-09-22 ENCOUNTER — Other Ambulatory Visit: Payer: Self-pay | Admitting: Family Medicine

## 2024-09-22 ENCOUNTER — Other Ambulatory Visit: Payer: Self-pay | Admitting: Family

## 2024-09-22 ENCOUNTER — Ambulatory Visit: Admitting: Family Medicine

## 2024-09-22 VITALS — BP 120/82 | HR 81 | Temp 97.7°F | Ht 63.0 in | Wt 187.0 lb

## 2024-09-22 DIAGNOSIS — Z0000 Encounter for general adult medical examination without abnormal findings: Secondary | ICD-10-CM

## 2024-09-22 DIAGNOSIS — M5412 Radiculopathy, cervical region: Secondary | ICD-10-CM

## 2024-09-22 DIAGNOSIS — Z23 Encounter for immunization: Secondary | ICD-10-CM

## 2024-09-22 DIAGNOSIS — E88819 Insulin resistance, unspecified: Secondary | ICD-10-CM | POA: Diagnosis not present

## 2024-09-22 DIAGNOSIS — I10 Essential (primary) hypertension: Secondary | ICD-10-CM | POA: Diagnosis not present

## 2024-09-22 DIAGNOSIS — Z1231 Encounter for screening mammogram for malignant neoplasm of breast: Secondary | ICD-10-CM

## 2024-09-22 DIAGNOSIS — F5101 Primary insomnia: Secondary | ICD-10-CM

## 2024-09-22 DIAGNOSIS — Z0001 Encounter for general adult medical examination with abnormal findings: Secondary | ICD-10-CM

## 2024-09-22 LAB — COMPREHENSIVE METABOLIC PANEL WITH GFR
ALT: 18 U/L (ref 3–35)
AST: 20 U/L (ref 5–37)
Albumin: 4.2 g/dL (ref 3.5–5.2)
Alkaline Phosphatase: 111 U/L (ref 39–117)
BUN: 18 mg/dL (ref 6–23)
CO2: 29 meq/L (ref 19–32)
Calcium: 9.8 mg/dL (ref 8.4–10.5)
Chloride: 104 meq/L (ref 96–112)
Creatinine, Ser: 1.01 mg/dL (ref 0.40–1.20)
GFR: 59.59 mL/min — ABNORMAL LOW (ref >60.00)
Glucose, Bld: 108 mg/dL — ABNORMAL HIGH (ref 70–99)
Potassium: 4.3 meq/L (ref 3.5–5.1)
Sodium: 140 meq/L (ref 135–145)
Total Bilirubin: 0.5 mg/dL (ref 0.2–1.2)
Total Protein: 6.8 g/dL (ref 6.0–8.3)

## 2024-09-22 LAB — CBC WITH DIFFERENTIAL/PLATELET
Basophils Absolute: 0 K/uL (ref 0.0–0.1)
Basophils Relative: 0.3 % (ref 0.0–3.0)
Eosinophils Absolute: 0 K/uL (ref 0.0–0.7)
Eosinophils Relative: 0.6 % (ref 0.0–5.0)
HCT: 39.6 % (ref 36.0–46.0)
Hemoglobin: 13 g/dL (ref 12.0–15.0)
Lymphocytes Relative: 15.7 % (ref 12.0–46.0)
Lymphs Abs: 1.2 K/uL (ref 0.7–4.0)
MCHC: 32.9 g/dL (ref 30.0–36.0)
MCV: 95.7 fl (ref 78.0–100.0)
Monocytes Absolute: 0.6 K/uL (ref 0.1–1.0)
Monocytes Relative: 8.4 % (ref 3.0–12.0)
Neutro Abs: 5.7 K/uL (ref 1.4–7.7)
Neutrophils Relative %: 75 % (ref 43.0–77.0)
Platelets: 292 K/uL (ref 150.0–400.0)
RBC: 4.14 Mil/uL (ref 3.87–5.11)
RDW: 14.2 % (ref 11.5–15.5)
WBC: 7.6 K/uL (ref 4.0–10.5)

## 2024-09-22 LAB — LIPID PANEL
Cholesterol: 182 mg/dL (ref 28–200)
HDL: 49.2 mg/dL (ref >39.00)
LDL Cholesterol: 115 mg/dL — ABNORMAL HIGH (ref 10–99)
NonHDL: 132.32
Total CHOL/HDL Ratio: 4
Triglycerides: 87 mg/dL (ref 10.0–149.0)
VLDL: 17.4 mg/dL (ref 0.0–40.0)

## 2024-09-22 LAB — HEMOGLOBIN A1C: Hgb A1c MFr Bld: 5.8 % (ref 4.6–6.5)

## 2024-09-22 LAB — TSH: TSH: 0.55 u[IU]/mL (ref 0.35–5.50)

## 2024-09-22 MED ORDER — TRAZODONE HCL 100 MG PO TABS: 100.0000 mg | ORAL_TABLET | Freq: Every day | ORAL | 3 refills | Status: AC

## 2024-09-22 MED ORDER — PREDNISONE 10 MG PO TABS: ORAL_TABLET | ORAL | 0 refills | Status: DC

## 2024-09-22 MED ORDER — KETOROLAC TROMETHAMINE 60 MG/2ML IM SOLN
60.0000 mg | Freq: Once | INTRAMUSCULAR | Status: AC
  Administered 2024-09-22: 12:00:00 60 mg via INTRAMUSCULAR

## 2024-09-22 MED ORDER — GABAPENTIN 300 MG PO CAPS: 300.0000 mg | ORAL_CAPSULE | Freq: Three times a day (TID) | ORAL | Status: DC

## 2024-09-22 MED ORDER — HYDROCODONE-ACETAMINOPHEN 5-325 MG PO TABS: 1.0000 | ORAL_TABLET | Freq: Every day | ORAL | 0 refills | Status: DC | PRN

## 2024-09-22 NOTE — Patient Instructions (Signed)
 Please return in 12 mo for cpe  I will release your lab results to you on your MyChart account with further instructions. You may see the results before I do, but when I review them I will send you a message with my report or have my assistant call you if things need to be discussed. Please reply to my message with any questions. Thank you!   If you have any questions or concerns, please don't hesitate to send me a message via MyChart or call the office at [phone]. Thank you for visiting with us  today! It's our pleasure caring for you.    VISIT SUMMARY: Today, you were seen for radiating arm pain and numbness that started about a week ago. The pain begins in your shoulder and travels down your arm, causing numbness and tingling in two fingers. This has made it difficult for you to sleep and work, and you described the experience as 'totally miserable.' You have a history of low back issues that were previously managed successfully. Recent spinal imaging showed narrowing in your cervical spine, which is causing nerve root impingement.  YOUR PLAN: -CERVICAL RADICULOPATHY: Cervical radiculopathy is a condition where a nerve in your neck is pinched or irritated, causing pain, numbness, and tingling that can radiate down your arm. To help reduce the nerve irritation and swelling, you have been prescribed prednisone . Your gabapentin  dosage has been increased to 300 mg three times a day, starting with twice a day for a few days to ensure you tolerate it well. You have also been prescribed a stronger pain medication for nighttime use and received a Toradol  injection for immediate pain relief. If your symptoms do not improve in a week, we will consider referring you to a spine specialist and possibly getting an MRI of your spine.  -GENERAL HEALTH MAINTENANCE: You are overdue for a mammogram. It is important to schedule and complete this screening to maintain your overall health.  INSTRUCTIONS: Please  follow the new medication regimen as prescribed: prednisone , increased gabapentin , and the stronger pain medication for nighttime use. Monitor your symptoms and if there is no improvement in a week, contact us  to discuss a referral to a spine specialist and the possibility of an MRI. Additionally, schedule and complete your overdue mammogram.                      Contains text generated by [employer]bridge.                                 Contains text generated by Abridge.

## 2024-09-22 NOTE — Progress Notes (Signed)
 See mychart note

## 2024-09-22 NOTE — Telephone Encounter (Signed)
 Pt was seen in office today and it was addressed

## 2024-09-22 NOTE — Progress Notes (Signed)
 Subjective  Chief Complaint  Patient presents with   Annual Exam    Pt here for Annual Exam and is currently fasting. Mammo has not been scheduled   Hypertension   Shoulder Pain    Lt shoulder pain for the past week    HPI: Jeanne Parks is a 63 y.o. female who presents to [facility] [place] today for a Female Wellness Visit. She also has the concerns and/or needs as listed above in the chief complaint. These will be addressed in addition to the Health Maintenance Visit.   Wellness Visit: annual visit with health maintenance review and exam  HM: overdue for mammo. Other screens are current. Eligible for prevnar  Chronic disease f/u and/or acute problem visit: (deemed necessary to be done in addition to the wellness visit): Discussed the use of AI scribe software for clinical note transcription with the patient, who gave verbal consent to proceed.  History of Present Illness Jeanne Parks is a 63 year old female who presents with radiating arm pain and numbness. Ireviewed ER and f/u visits.  Radiating arm pain and paresthesia - Pain onset approximately one week ago, starting in the shoulder and radiating down the arm - Associated numbness and tingling in two fingers - Difficulty using the hand; hand feels 'weird' but not weak - Describes current episode as 'miserable' ER eval showed cervical and neural foraminal stenosis: was treated conservatively. F/u visit: added meloxicam  and mm relaxer; Pt remains in pain with radicular sxs and parsesthesias in left 4th and 5th digits.   Impact on function and sleep - Difficulty sleeping due to pain - Continues to work despite pain - Describes overall experience as 'totally miserable'  Pain management - Currently taking gabapentin ; dose not adjusted during recent ER visit - Prescribed tramadol  at ER, but finds it ineffective for pain control - Current pain management is inadequate  Chronic problem f/u: IR: diet  is fair. No sxs of hyperglycemia  HTN: Feeling well. Taking medications w/o adverse effects. No symptoms of CHF, angina; no palpitations, sob, cp or lower extremity edema. Compliant with meds.   Insomnia still doing well with trazadone and mood is stable on meds.     Assessment  1. Encounter for well adult exam with abnormal findings   2. Screening mammogram for breast cancer   3. Insulin resistance   4. Essential hypertension   5. Need for pneumococcal vaccination   6. Primary insomnia   7. Cervical radiculopathy      Plan  Female Wellness Visit: Age appropriate Health Maintenance and Prevention measures were discussed with patient. Included topics are cancer screening recommendations, ways to keep healthy (see AVS) including dietary and exercise recommendations, regular eye and dental care, use of seat belts, and avoidance of moderate alcohol use and tobacco use.  BMI: discussed patient's BMI and encouraged positive lifestyle modifications to help get to or maintain a target BMI. HM needs and immunizations were addressed and ordered. See below for orders. See HM and immunization section for updates. Routine labs and screening tests ordered including cmp, cbc and lipids where appropriate. Discussed recommendations regarding Vit D and calcium supplementation (see AVS)  Chronic disease management visit and/or acute problem visit: Assessment and Plan Assessment & Plan Cervical radiculopathy Acute cervical radiculopathy with radiating pain from shoulder to arm, numbness, and tingling in two fingers, indicating nerve root impingement. Symptoms have persisted for about a week. Previous low back issues resolved. Current treatment with tramadol  and  gabapentin  is insufficient. CT scan shows narrowing in cervical spine with neuroforaminal areas and bulge, causing nerve root impingement. - Prescribed prednisone  to reduce nerve root irritation and swelling. - Increased gabapentin  to 300 mg three  times a day, starting with twice a day for a few days to ensure tolerance. - Prescribed stronger pain medication for nighttime use. - Administered Toradol  injection for immediate pain relief. - If symptoms do not improve in a week, will consider referral to spine specialist and possible MRI of the spine.  HTN: This medical condition is well controlled. There are no signs of complications, medication side effects, or red flags. Patient is instructed to continue the current treatment plan without change in therapies or medications.   Insomnia: This medical condition is well controlled. There are no signs of complications, medication side effects, or red flags. Patient is instructed to continue the current treatment plan without change in therapies or medications.   IR: continues to work on diet. Will recheck levels today.  General Health Maintenance Mammogram is overdue. - Schedule and complete overdue mammogram.    Follow up: 12 mo for cpe  Orders Placed This Encounter  Procedures   CBC with Differential/Platelet   Comprehensive metabolic panel with GFR   Lipid panel   Hemoglobin A1c   TSH   Meds ordered this encounter  Medications   traZODone  (DESYREL ) 100 MG tablet    Sig: Take 1 tablet (100 mg total) by mouth at bedtime.    Dispense:  90 tablet    Refill:  3   ketorolac  (TORADOL ) injection 60 mg   predniSONE  (DELTASONE ) 10 MG tablet    Sig: Take 4 tabs qd x 2 days, 3 qd x 2 days, 2 qd x 2d, 1qd x 3 days    Dispense:  21 tablet    Refill:  0   HYDROcodone -acetaminophen  (NORCO/VICODIN) 5-325 MG tablet    Sig: Take 1-2 tablets by mouth daily as needed for moderate pain (pain score 4-6).    Dispense:  30 tablet    Refill:  0   gabapentin  (NEURONTIN ) 300 MG capsule    Sig: Take 1 capsule (300 mg total) by mouth 3 (three) times daily.      Body mass index is 33.13 kg/m. 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    S/P  laparoscopic hysterectomy 01/29/2022     Priority: Low   Seasonal allergies      Priority: Low   Health Maintenance  Topic Date Due   COVID-19 Vaccine (4 - 2025-26 season) 09/23/2024 (Originally 06/08/2024)   Pneumococcal Vaccine: 50+ Years (2 of 2 - PCV) 09/07/2025 (Originally 05/24/2020)   Mammogram  09/07/2025 (Originally 11/08/2023)   DTaP/Tdap/Td (2 - Td or Tdap) 10/31/2026   Colonoscopy  05/02/2031   Zoster Vaccines- Shingrix   Completed   Hepatitis B Vaccines 19-59 Average Risk  Aged Out   HPV VACCINES  Aged Out   Meningococcal B Vaccine  Aged Out   Influenza Vaccine  Discontinued   Immunization History  Administered Date(s) Administered   Influenza,inj,Quad PF,6+ Mos 08/20/2018, 08/01/2021   PFIZER(Purple Top)SARS-COV-2 Vaccination 01/23/2020, 02/15/2020, 10/26/2020   Pneumococcal Polysaccharide-23 05/25/2019   Tdap 10/31/2016   Zoster Recombinant(Shingrix ) 08/01/2021, 03/14/2022   We updated and reviewed the patient's past history in detail and it is documented  below. Allergies: Patient has no known allergies. Past Medical History Patient  has a past medical history of Allergy, Anxiety, DJD (degenerative joint disease), lumbar (06/06/2020), History of chicken pox, HLD (hyperlipidemia), HSV-1 infection, Hypertension, OA (osteoarthritis) of right knee, s/p arthroscopy, PMB (postmenopausal bleeding), Seasonal allergies, Skin cancer, Tachycardia (12/21/2021), Tooth size and form abnormality (12/21/2021), and Wears glasses for reading. Past Surgical History Patient  has a past surgical history that includes Cesarean section (08/27/1986); Dilation and curettage of uterus (1997); Knee arthroscopy (Right, 02/2015); Tonsillectomy (1967); Breast cyst excision (Left, 10+ yrs ago); colonscopy (2021); Dilatation & curettage/hysteroscopy with myosure (N/A, 12/26/2021); Total laparoscopic hysterectomy with bilateral salpingo oophorectomy (Bilateral, 01/29/2022); Cystoscopy (N/A, 01/29/2022); and Abdominal hysterectomy (01/2022). Family History: Patient family history includes Breast cancer (age of onset: 36) in her mother; Cancer in her paternal uncle; Colon polyps in her father; Diabetes type I (age of onset: 39) in her sister; Heart disease in her father; High Cholesterol in her sister; Hypertension in her mother; Lung cancer in her maternal aunt. Social History:  Patient  reports that she has never smoked. She has been exposed to tobacco smoke. She has never used smokeless tobacco. She reports current alcohol use of about 2.0 - 3.0 standard drinks of alcohol per week. She reports that she does not use drugs.  Review of Systems: Constitutional: negative for fever or malaise Ophthalmic: negative for photophobia, double vision or loss of vision Cardiovascular: negative for chest pain, dyspnea on exertion, or new LE swelling Respiratory: negative for SOB or persistent cough Gastrointestinal: negative for abdominal pain, change in bowel habits or melena Genitourinary:  negative for dysuria or gross hematuria, no abnormal uterine bleeding or disharge Musculoskeletal: negative for new gait disturbance or muscular weakness Integumentary: negative for new or persistent rashes, no breast lumps Neurological: negative for TIA or stroke symptoms Psychiatric: negative for SI or delusions Allergic/Immunologic: negative for hives  Patient Care Team    Relationship Specialty Notifications Start End  Jodie Lavern CROME, MD PCP - General Family Medicine  07/24/19   Jannis Kate Norris, MD Consulting Physician Obstetrics and Gynecology  07/25/18   Deanie Norleen ORN, PT Physical Therapist Physical Therapy  06/20/20     Objective  Vitals: BP 120/82   Pulse 81   Temp 97.7 F (36.5 C)   Ht 5' 3 (1.6 m)   Wt 187 lb (84.8 kg)   LMP 01/06/2014   SpO2 97%   BMI 33.13 kg/m  General:  Well developed, well nourished, appears uncomfortable Psych:  Alert  and orientedx3,normal mood and affect HEENT:  Normocephalic, atraumatic, non-icteric sclera,  supple neck without adenopathy, mass or thyromegaly Cardiovascular:  Normal S1, S2, RRR without gallop, rub or murmur Respiratory:  Good breath sounds bilaterally, CTAB with normal respiratory effort Gastrointestinal: normal bowel sounds, soft, non-tender, no noted masses. No HSM MSK: extremities without edema, joints without erythema or swelling Neurologic:    Mental status is normal.  Paresthesia left 4th and 5th digit with decreased gripping due to that.  Commons side effects, risks, benefits, and alternatives for medications and treatment plan prescribed today were discussed, and the patient expressed understanding of the given instructions. Patient is instructed to call or message via MyChart if he/she has any questions or concerns regarding our treatment plan. No barriers to understanding were identified. We discussed Red Flag symptoms and signs in detail. Patient expressed understanding regarding what to do in case of urgent  or emergency type symptoms.  Medication list was reconciled, printed and provided to the patient in AVS. Patient instructions and summary information was reviewed with the patient as documented in the AVS. This note was prepared with assistance of Dragon voice recognition software. Occasional wrong-word or sound-a-like substitutions may have occurred due to the inherent limitations of voice recognition software

## 2024-10-05 ENCOUNTER — Other Ambulatory Visit: Payer: Self-pay | Admitting: Family Medicine

## 2024-10-08 ENCOUNTER — Other Ambulatory Visit: Payer: Self-pay | Admitting: Family Medicine

## 2024-10-22 ENCOUNTER — Ambulatory Visit: Admitting: Family Medicine

## 2024-10-22 ENCOUNTER — Encounter: Payer: Self-pay | Admitting: Family Medicine

## 2024-10-22 VITALS — BP 129/83 | HR 96 | Temp 97.7°F | Ht 63.0 in | Wt 188.6 lb

## 2024-10-22 DIAGNOSIS — R29898 Other symptoms and signs involving the musculoskeletal system: Secondary | ICD-10-CM

## 2024-10-22 DIAGNOSIS — M5412 Radiculopathy, cervical region: Secondary | ICD-10-CM

## 2024-10-22 MED ORDER — HYDROCODONE-ACETAMINOPHEN 5-325 MG PO TABS: 1.0000 | ORAL_TABLET | Freq: Every day | ORAL | 0 refills | Status: DC | PRN

## 2024-10-22 MED ORDER — KETOROLAC TROMETHAMINE 60 MG/2ML IM SOLN
60.0000 mg | Freq: Once | INTRAMUSCULAR | Status: AC
  Administered 2024-10-22: 60 mg via INTRAMUSCULAR

## 2024-10-22 MED ORDER — PREDNISONE 10 MG PO TABS: ORAL_TABLET | ORAL | 0 refills | Status: AC

## 2024-10-22 MED ORDER — KETOROLAC TROMETHAMINE 60 MG/2ML IM SOLN: 60.0000 mg | Freq: Once | INTRAMUSCULAR | Status: DC

## 2024-10-22 NOTE — Progress Notes (Signed)
 "  Subjective  CC:  Chief Complaint  Patient presents with   Cervical radiculopathy    Pt here to F/U with the pain in the shoulders and she is not getting any relief from it.    HPI: Jeanne Parks is a 64 y.o. female who presents to the office today to address the problems listed above in the chief complaint. 64 year old seen for cervical radiculopathy back in mid December.  She had an ER visit and 2 follow-up visits.  Treated with prednisone  taper, increase gabapentin  dose and hydrocodone .  Unfortunately she continues to do poorly.  She says pain is unrelieved and actually worsening.  Has now persistent numbness in her left 4th and 5th digits.  She also feels some weakness in the arm.  Pain starts at the left trap and radiates down the arm.  No injury.  CT scan done in the emergency room does show mild foraminal stenosis and cervical stenosis.  Osteophytes are present.  No further imaging has been done since 1216.  She has tried to be patient, used NSAIDs and intermittent hydrocodone  but at this point rates pain as severe.  She appears uncomfortable.  Assessment  1. Cervical radiculopathy   2. Weakness of left upper extremity      Plan  Cervical radiculopathy with persistent numbness tingling and possible mild weakness, persistent for greater than 6 weeks.:  Has failed conservative management.  Still very uncomfortable.  Treated with Toradol  60 mg now.  Repeat prednisone  taper and use hydrocodone  as needed.  Continue gabapentin .  Referral urgently to neurosurgery for further evaluation and treatment options.  Deferred MRI given had recent CT scan.  Although she may need this for neurosurgery.  Follow up: As needed Visit date not found  Orders Placed This Encounter  Procedures   Ambulatory referral to Neurosurgery   Meds ordered this encounter  Medications   predniSONE  (DELTASONE ) 10 MG tablet    Sig: Take 4 tabs qd x 2 days, 3 qd x 2 days, 2 qd x 2d, 1qd x 3 days    Dispense:  21  tablet    Refill:  0   HYDROcodone -acetaminophen  (NORCO/VICODIN) 5-325 MG tablet    Sig: Take 1-2 tablets by mouth daily as needed for moderate pain (pain score 4-6).    Dispense:  30 tablet    Refill:  0   ketorolac  (TORADOL ) injection 60 mg      I reviewed the patients updated PMH, FH, and SocHx.    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 Family History: Patient family history includes Breast cancer (age of onset:  50) in her mother; Cancer in her paternal uncle; Colon polyps in her father; Diabetes type I (age of onset: 37) in her sister; Heart disease in her father; High Cholesterol in her sister; Hypertension in her mother; Lung cancer in her maternal aunt. Social History:  Patient  reports that she has never smoked. She has been exposed to tobacco smoke. She has never used smokeless tobacco. She reports current alcohol use of about 2.0 - 3.0 standard drinks of alcohol per week. She reports that she does not use drugs.  Review of Systems: Constitutional: Negative for fever malaise or anorexia Cardiovascular: negative for chest pain Respiratory: negative for SOB or persistent  cough Gastrointestinal: negative for abdominal pain  Objective  Vitals: BP 129/83   Pulse 96   Temp 97.7 F (36.5 C)   Ht 5' 3 (1.6 m)   Wt 188 lb 9.6 oz (85.5 kg)   LMP 01/06/2014   SpO2 100%   BMI 33.41 kg/m  General: Appears very uncomfortable, alert and oriented x 3 Cervical spine: Nontender, supple Neuro: Decreased sensation in 4th and 5th digits, mildly decreased grip strength, possibly related to pain.  Triceps 4+/5 on left 5 out of 5 on right, biceps 5 out of 5 bilaterally  Commons side effects, risks, benefits, and alternatives for medications and treatment plan prescribed today were discussed, and the patient expressed understanding of the given instructions. Patient is instructed to call or message via MyChart if he/she has any questions or concerns regarding our treatment plan. No barriers to understanding were identified. We discussed Red Flag symptoms and signs in detail. Patient expressed understanding regarding what to do in case of urgent or emergency type symptoms.  Medication list was reconciled, printed and provided to the patient in AVS. Patient instructions and summary information was reviewed with the patient as documented in the AVS. This note was prepared with assistance of Dragon voice recognition software. Occasional wrong-word or sound-a-like substitutions may have occurred due to the inherent limitations of voice recognition software        [1]  Current Meds  Medication Sig   ALPRAZolam  (XANAX ) 0.5 MG tablet Take 1 tablet (0.5 mg total) by mouth daily as needed for anxiety.   Biotin 89999 MCG TABS Take by mouth.   cetirizine (ZYRTEC) 10 MG tablet Take 10 mg by mouth daily.   cholecalciferol (VITAMIN D ) 1000 units tablet Take 1,000 Units by mouth daily.   Cyanocobalamin  (VITAMIN B 12 PO) Take by mouth.   escitalopram  (LEXAPRO ) 10 MG tablet Take 1 tablet (10 mg total) by mouth daily.   gabapentin  (NEURONTIN ) 300 MG capsule TAKE ONE CAPSULE BY MOUTH AT  BEDTIME   metoprolol  succinate (TOPROL -XL) 100 MG 24 hr tablet Take 1 tablet (100 mg total) by mouth daily.   predniSONE  (DELTASONE ) 10 MG tablet Take 4 tabs qd x 2 days, 3 qd x 2 days, 2 qd x 2d, 1qd x 3 days   traZODone  (DESYREL ) 100 MG tablet Take 1 tablet (100 mg total) by mouth at bedtime.   [DISCONTINUED] HYDROcodone -acetaminophen  (NORCO/VICODIN) 5-325 MG tablet Take 1-2 tablets by mouth daily as needed for moderate pain (pain score 4-6).   [DISCONTINUED] predniSONE  (DELTASONE ) 10 MG tablet Take 4 tabs qd x 2 days, 3 qd x 2 days, 2 qd x 2d, 1qd x 3 days   Current Facility-Administered Medications for the 10/22/24 encounter (Office Visit) with Jodie Lavern CROME, MD  Medication   ketorolac  (TORADOL ) injection 60 mg   "

## 2024-10-22 NOTE — Patient Instructions (Addendum)
 Please follow up if symptoms do not improve or as needed.    We will call you to get you a neurosurgical evaluation as soon as possible.  Please take the medications as prescribed

## 2024-10-27 ENCOUNTER — Other Ambulatory Visit: Payer: Self-pay | Admitting: Family Medicine

## 2024-10-27 ENCOUNTER — Encounter: Payer: Self-pay | Admitting: Family Medicine

## 2024-10-27 NOTE — Telephone Encounter (Signed)
 10/22/2024 LOV  04/29/2024 fill date  30/5 refills

## 2024-10-28 ENCOUNTER — Other Ambulatory Visit: Payer: Self-pay

## 2024-10-28 NOTE — Telephone Encounter (Signed)
 Last OV: 10/22/24  Next OV: 09/24/25  Last filled: 04/29/24  Quantity: 30 w/ 1 refill  Please see pt Mychart msg for refill request to refill Tramadol  since unable to take Hydrocodone  while at work.

## 2024-11-02 MED ORDER — TRAMADOL HCL 50 MG PO TABS: 50.0000 mg | ORAL_TABLET | Freq: Four times a day (QID) | ORAL | 1 refills | Status: AC | PRN

## 2024-11-26 ENCOUNTER — Ambulatory Visit: Admitting: Orthopedic Surgery

## 2025-09-24 ENCOUNTER — Encounter: Admitting: Family Medicine
